# Patient Record
Sex: Female | Born: 1955 | Race: White | Hispanic: No | Marital: Married | Smoking: Current every day smoker
Health system: Southern US, Community
[De-identification: ages and names within clinical notes are randomized; demographics above are authoritative.]

## PROBLEM LIST (undated history)

## (undated) DIAGNOSIS — R569 Unspecified convulsions: Secondary | ICD-10-CM

## (undated) DIAGNOSIS — I82409 Acute embolism and thrombosis of unspecified deep veins of unspecified lower extremity: Secondary | ICD-10-CM

## (undated) DIAGNOSIS — K219 Gastro-esophageal reflux disease without esophagitis: Secondary | ICD-10-CM

## (undated) DIAGNOSIS — K5792 Diverticulitis of intestine, part unspecified, without perforation or abscess without bleeding: Secondary | ICD-10-CM

## (undated) DIAGNOSIS — F419 Anxiety disorder, unspecified: Secondary | ICD-10-CM

## (undated) HISTORY — DX: Acute embolism and thrombosis of unspecified deep veins of unspecified lower extremity: I82.409

## (undated) HISTORY — PX: ENDOMETRIAL ABLATION: SHX621

## (undated) HISTORY — DX: Gastro-esophageal reflux disease without esophagitis: K21.9

## (undated) HISTORY — DX: Diverticulitis of intestine, part unspecified, without perforation or abscess without bleeding: K57.92

## (undated) HISTORY — DX: Anxiety disorder, unspecified: F41.9

## (undated) HISTORY — PX: TONSILLECTOMY: SUR1361

## (undated) HISTORY — PX: ABDOMINAL HYSTERECTOMY: SHX81

## (undated) HISTORY — PX: BREAST BIOPSY: SHX20

## (undated) HISTORY — DX: Unspecified convulsions: R56.9

---

## 1998-11-18 ENCOUNTER — Other Ambulatory Visit: Admission: RE | Admit: 1998-11-18 | Discharge: 1998-11-18 | Payer: Self-pay | Admitting: Obstetrics and Gynecology

## 1999-10-28 ENCOUNTER — Encounter: Payer: Self-pay | Admitting: Internal Medicine

## 1999-10-28 ENCOUNTER — Ambulatory Visit (HOSPITAL_COMMUNITY): Admission: RE | Admit: 1999-10-28 | Discharge: 1999-10-28 | Payer: Self-pay | Admitting: Internal Medicine

## 2000-02-16 ENCOUNTER — Other Ambulatory Visit: Admission: RE | Admit: 2000-02-16 | Discharge: 2000-02-16 | Payer: Self-pay | Admitting: Obstetrics and Gynecology

## 2000-11-09 ENCOUNTER — Encounter: Payer: Self-pay | Admitting: Internal Medicine

## 2000-11-09 ENCOUNTER — Encounter: Admission: RE | Admit: 2000-11-09 | Discharge: 2000-11-09 | Payer: Self-pay | Admitting: Internal Medicine

## 2001-01-21 ENCOUNTER — Encounter: Admission: RE | Admit: 2001-01-21 | Discharge: 2001-01-21 | Payer: Self-pay | Admitting: Internal Medicine

## 2001-01-21 ENCOUNTER — Encounter: Payer: Self-pay | Admitting: Internal Medicine

## 2002-01-14 ENCOUNTER — Other Ambulatory Visit: Admission: RE | Admit: 2002-01-14 | Discharge: 2002-01-14 | Payer: Self-pay | Admitting: Obstetrics and Gynecology

## 2002-07-08 ENCOUNTER — Encounter: Payer: Self-pay | Admitting: Obstetrics and Gynecology

## 2002-07-14 ENCOUNTER — Inpatient Hospital Stay (HOSPITAL_COMMUNITY): Admission: RE | Admit: 2002-07-14 | Discharge: 2002-07-16 | Payer: Self-pay | Admitting: Obstetrics and Gynecology

## 2002-07-14 ENCOUNTER — Encounter (INDEPENDENT_AMBULATORY_CARE_PROVIDER_SITE_OTHER): Payer: Self-pay

## 2002-10-15 ENCOUNTER — Encounter: Admission: RE | Admit: 2002-10-15 | Discharge: 2002-10-15 | Payer: Self-pay | Admitting: Sports Medicine

## 2002-11-12 ENCOUNTER — Encounter: Admission: RE | Admit: 2002-11-12 | Discharge: 2002-11-12 | Payer: Self-pay | Admitting: Sports Medicine

## 2005-08-02 ENCOUNTER — Ambulatory Visit: Payer: Self-pay | Admitting: Sports Medicine

## 2007-03-17 ENCOUNTER — Encounter: Admission: RE | Admit: 2007-03-17 | Discharge: 2007-03-17 | Payer: Self-pay | Admitting: Internal Medicine

## 2007-10-14 ENCOUNTER — Ambulatory Visit: Payer: Self-pay | Admitting: Internal Medicine

## 2008-06-26 ENCOUNTER — Other Ambulatory Visit: Admission: RE | Admit: 2008-06-26 | Discharge: 2008-06-26 | Payer: Self-pay | Admitting: Internal Medicine

## 2008-06-26 ENCOUNTER — Ambulatory Visit: Payer: Self-pay | Admitting: Internal Medicine

## 2008-07-17 ENCOUNTER — Ambulatory Visit: Payer: Self-pay | Admitting: Internal Medicine

## 2008-07-17 ENCOUNTER — Encounter: Admission: RE | Admit: 2008-07-17 | Discharge: 2008-07-17 | Payer: Self-pay | Admitting: Internal Medicine

## 2008-07-27 ENCOUNTER — Ambulatory Visit: Payer: Self-pay | Admitting: Internal Medicine

## 2008-10-16 ENCOUNTER — Ambulatory Visit: Payer: Self-pay | Admitting: Internal Medicine

## 2008-12-28 ENCOUNTER — Ambulatory Visit: Payer: Self-pay | Admitting: Internal Medicine

## 2009-08-09 ENCOUNTER — Ambulatory Visit (HOSPITAL_COMMUNITY): Admission: RE | Admit: 2009-08-09 | Discharge: 2009-08-09 | Payer: Self-pay | Admitting: Internal Medicine

## 2009-08-09 ENCOUNTER — Encounter: Payer: Self-pay | Admitting: Internal Medicine

## 2010-05-27 NOTE — Op Note (Signed)
NAME:  Anita Wagner, Anita Wagner                        ACCOUNT NO.:  [id number]   MEDICAL RECORD NO.:  [id number]                   PATIENT TYPE:  INP   LOCATION:  0003                                 FACILITY:  [facility]   PHYSICIAN:  Cynthia P. Romine, M.D.             DATE OF BIRTH:  12-19-1955   DATE OF PROCEDURE:  07/14/2002  DATE OF DISCHARGE:                                 OPERATIVE REPORT   PREOPERATIVE DIAGNOSIS:  Dysmenorrhea, abnormal uterine bleeding,  endometriosis.   POSTOPERATIVE DIAGNOSIS:  Dysmenorrhea, abnormal uterine bleeding,  endometriosis. Pathology pending.   OPERATION PERFORMED:  Total abdominal hysterectomy, bilateral salpingo-  oophorectomy.   SURGEON:  Cynthia P. Romine, M.D.   ASSISTANT:  Andres Ege, M.D.   ANESTHESIA:  General endotracheal.   ESTIMATED BLOOD LOSS:  .   COMPLICATIONS:  None.   DESCRIPTION OF PROCEDURE:  The patient was taken to the operating room and  after the induction of adequate general endotracheal anesthesia was placed  in supine position and prepped and draped in the usual sterile fashion.  A  Foley catheter was inserted.  A Pfannenstiel incision was made and carried  down to the fascia using a deep knife.  The fascia was nicked and opened  transversely.  A Kocher was used to grasp the fascial margins.  Then we  separated the midline rectus muscles using the Bovie.  The rectus muscles  were separated bluntly in the midline.  The underlying peritoneum was  elevated between hemostats and entered atraumatically.  The peritoneum was  opened vertically.  The abdomen was explored.  The liver edge was smooth.  The gallbladder contained no stones.  The aorta and pelvic vessels were  without signs of surrounding adenopathy.  In the pelvis the uterus was small  and mobile.  The right ovary had several powder burn areas of endometriosis  on it and the left ovary was adherent to the posterior leaves of the broad  ligament and  contained some powder burn areas of endometriosis.  The round  ligaments were stitched and divided.  The anterior leaf of the broad  ligament was taken down.  The ureters were identified bilaterally.  The  infundibulopelvic arteries were skeletonized, doubly clamped, cut and doubly  tied with 0 Vicryl.  The uterine arteries were skeletonized, clamped, cut  and doubly tied.  The cardinal ligaments were clamped, cut and tied down to  the level of the uterosacral ligament.  The vagina was entered at the left  angle and the uterus removed with Satinsky scissors.  The vaginal cuff was  grasped with Kochers.  Angle sutures were placed at the right and left and  then the cuff was closed with interrupted figure-of-eight sutures of 0  Vicryl.  The pelvis was irrigated and felt to be hemostatic.  The  uterosacral ligaments were tied together in the midline for pelvic support.  The packs were removed.  The bowel was allowed to return to its anatomic  position.  The peritoneum was closed in running fashion with 2-0 chromic.  The rectus muscles were irrigated and hemostasis was achieved with the  Bovie.  The fascia was closed with 0 Vicryl going from each angle towards  the midline.  The subcutaneous tissue was irrigated, hemostasis was achieved  with a Bovie.  The skin was closed with subcuticular stitch of 4-0 Vicryl  Rapide.  The  incision was injected with 10mL of 0.5% Marcaine with epinephrine.  Benzoin  was applied to the skin.  Steri-Strips were applied and dressing was  applied.  The procedure was terminated.  The patient tolerated the procedure  well and went in satisfactory condition to post anesthesia recovery.                                                Cynthia P. Romine, M.D.    CPR/MEDQ  D:  07/14/2002  T:  07/14/2002  Job:  [id number]

## 2010-06-27 ENCOUNTER — Ambulatory Visit (INDEPENDENT_AMBULATORY_CARE_PROVIDER_SITE_OTHER): Payer: BC Managed Care – PPO | Admitting: Internal Medicine

## 2010-06-27 ENCOUNTER — Encounter: Payer: Self-pay | Admitting: Internal Medicine

## 2010-06-27 DIAGNOSIS — F419 Anxiety disorder, unspecified: Secondary | ICD-10-CM

## 2010-06-27 DIAGNOSIS — H6690 Otitis media, unspecified, unspecified ear: Secondary | ICD-10-CM

## 2010-06-27 DIAGNOSIS — K219 Gastro-esophageal reflux disease without esophagitis: Secondary | ICD-10-CM

## 2010-06-27 DIAGNOSIS — H6692 Otitis media, unspecified, left ear: Secondary | ICD-10-CM

## 2010-06-27 DIAGNOSIS — J309 Allergic rhinitis, unspecified: Secondary | ICD-10-CM

## 2010-06-27 DIAGNOSIS — F411 Generalized anxiety disorder: Secondary | ICD-10-CM

## 2010-07-06 DIAGNOSIS — J309 Allergic rhinitis, unspecified: Secondary | ICD-10-CM | POA: Insufficient documentation

## 2010-07-06 DIAGNOSIS — K219 Gastro-esophageal reflux disease without esophagitis: Secondary | ICD-10-CM | POA: Insufficient documentation

## 2010-07-06 DIAGNOSIS — F419 Anxiety disorder, unspecified: Secondary | ICD-10-CM | POA: Insufficient documentation

## 2010-07-06 NOTE — Progress Notes (Signed)
  Subjective:    Patient ID: Anita Wagner, female    DOB: 16-Aug-1955, 55 y.o.   MRN: [id number]  HPI onset yesterday of left ear pain. History of allergic rhinitis, GE reflux, anxiety. Some stuffy nose. No discolored nasal drainage or productive cough. Anxiety under good control with Klonopin. Takes Protonix generic 40 mg daily for reflux.    Review of Systems     Objective:   Physical Exam left TM is full and pink. Right TM slightly full but not discolored. Boggy nasal mucosa. Pharynx is clear. Neck is supple without adenopathy. Chest is clear to auscultation without rales or wheezing        Assessment & Plan:  1-left otitis media    2-allergic rhinitis  3-anxiety-stable  4-GE reflux treated with generic protonix  Plan Zithromax Z-Pak 2 tablets by mouth day one followed by 1 tablet by mouth days 2 through 5.

## 2010-07-06 NOTE — Patient Instructions (Signed)
Take meds as directed. Call if not better in 10 days sooner if worse.

## 2010-07-15 ENCOUNTER — Other Ambulatory Visit: Payer: Self-pay

## 2010-07-15 MED ORDER — CLONAZEPAM 0.5 MG PO TABS: 0.5000 mg | ORAL_TABLET | Freq: Two times a day (BID) | ORAL | Status: DC | PRN

## 2010-09-26 ENCOUNTER — Other Ambulatory Visit: Payer: BC Managed Care – PPO | Admitting: Internal Medicine

## 2010-09-27 ENCOUNTER — Encounter: Payer: BC Managed Care – PPO | Admitting: Internal Medicine

## 2010-11-08 ENCOUNTER — Encounter: Payer: Self-pay | Admitting: Internal Medicine

## 2010-11-08 ENCOUNTER — Ambulatory Visit (INDEPENDENT_AMBULATORY_CARE_PROVIDER_SITE_OTHER): Payer: BC Managed Care – PPO | Admitting: Internal Medicine

## 2010-11-08 VITALS — BP 118/72 | HR 76 | Temp 98.9°F | Wt 164.0 lb

## 2010-11-08 DIAGNOSIS — F411 Generalized anxiety disorder: Secondary | ICD-10-CM

## 2010-11-08 DIAGNOSIS — Z87891 Personal history of nicotine dependence: Secondary | ICD-10-CM

## 2010-11-08 DIAGNOSIS — F419 Anxiety disorder, unspecified: Secondary | ICD-10-CM

## 2010-11-08 DIAGNOSIS — J069 Acute upper respiratory infection, unspecified: Secondary | ICD-10-CM

## 2010-11-08 NOTE — Patient Instructions (Signed)
Please take Zithromax Z-PAK as directed. You have one refill on this prescription. If not better in 7 days, have prescription refill. Please return in 2-3 weeks for influenza immunization and Pneumovax immunization

## 2010-11-08 NOTE — Progress Notes (Signed)
  Subjective:    Patient ID: Marya Fossa, female    DOB: Apr 05, 1955, 55 y.o.   MRN: [id number]  HPI patient in today with sore throat and ear pain. Has been around some children who were ill recently. Husband is 45 years old and has retired. She continues to work in Therapist, sports. Continues to smoke. Her mother lives alone, has some health issues, financial problems, and is causing patient some stress.    Review of Systems     Objective:   Physical Exam HEENT exam: Pharynx slightly injected, left TM is full but not red right TM is within normal limits neck is supple; no significant adenopathy; chest clear to auscultation        Assessment & Plan:  URI  Anxiety  History of smoking  Plan: Zithromax Z-Pak take hysterectomy with one refill. If not better in one week patient is to refill Zithromax Z-PAK. Patient wants to return soon for influenza immunization and Pneumovax immunization.

## 2010-11-30 ENCOUNTER — Other Ambulatory Visit: Payer: Self-pay

## 2010-11-30 MED ORDER — ESTRADIOL 0.05 MG/24HR TD PTTW: 1.0000 | MEDICATED_PATCH | TRANSDERMAL | Status: DC

## 2010-12-09 ENCOUNTER — Ambulatory Visit (INDEPENDENT_AMBULATORY_CARE_PROVIDER_SITE_OTHER): Payer: BC Managed Care – PPO | Admitting: Internal Medicine

## 2010-12-09 DIAGNOSIS — Z23 Encounter for immunization: Secondary | ICD-10-CM

## 2010-12-09 NOTE — Patient Instructions (Signed)
You have received influenza vaccine today. You may take Tylenol as needed over the next 24 hours for arm soreness

## 2010-12-09 NOTE — Progress Notes (Signed)
  Subjective:    Patient ID: Anita Wagner, female    DOB: [birth year], 55 y.o.   MRN: [id number]  HPI influenza immunization administered by nurse today    Review of Systems     Objective:   Physical Exam        Assessment & Plan:  Flu vaccine

## 2011-03-09 ENCOUNTER — Other Ambulatory Visit: Payer: Self-pay | Admitting: Internal Medicine

## 2011-03-27 ENCOUNTER — Other Ambulatory Visit: Payer: Self-pay

## 2011-03-27 MED ORDER — CLONAZEPAM 0.5 MG PO TABS: 0.5000 mg | ORAL_TABLET | Freq: Two times a day (BID) | ORAL | Status: DC | PRN

## 2011-04-17 ENCOUNTER — Other Ambulatory Visit: Payer: Self-pay

## 2011-04-17 MED ORDER — TRIAMCINOLONE ACETONIDE 0.1 % EX CREA: TOPICAL_CREAM | Freq: Two times a day (BID) | CUTANEOUS | Status: AC

## 2011-04-21 ENCOUNTER — Other Ambulatory Visit: Payer: Self-pay

## 2011-10-05 ENCOUNTER — Other Ambulatory Visit: Payer: Self-pay | Admitting: Internal Medicine

## 2011-10-05 ENCOUNTER — Ambulatory Visit (INDEPENDENT_AMBULATORY_CARE_PROVIDER_SITE_OTHER): Payer: BC Managed Care – PPO | Admitting: Internal Medicine

## 2011-10-05 VITALS — Temp 98.0°F

## 2011-10-05 DIAGNOSIS — J029 Acute pharyngitis, unspecified: Secondary | ICD-10-CM

## 2011-10-05 NOTE — Telephone Encounter (Signed)
Please check when pt last had PE or visit for anxiety and refill accordingly with appropriate appt.

## 2011-10-09 ENCOUNTER — Encounter: Payer: Self-pay | Admitting: Internal Medicine

## 2011-10-09 NOTE — Patient Instructions (Addendum)
Take Zithromax 2 tablets day one followed by 1 tablet days 2 through 5. Call if not better in one week. 

## 2011-10-09 NOTE — Progress Notes (Signed)
  Subjective:    Patient ID: Anita Wagner, female    DOB: 05/15/1955, 56 y.o.   MRN: [id number]  HPI 56 year old white female with history of anxiety called around 5 PM saying that she had sore throat and wanted to make appointment for tomorrow. I will not be in the office tomorrow so I asked patient to come on over and be seen today. Patient says she's had sore throat now for over a week. She thought she would get better but has not gotten better. In fact has gotten a bit worse. Has not had any fever or shaking chills. However says she has malaise and fatigue and is concerned about the duration of sore throat. No discolored drainage. No significant cough.    Review of Systems     Objective:   Physical Exam HEENT exam: Slightly injected pharynx without exudate. TMs are slightly full bilaterally. Neck is supple without adenopathy or thyromegaly. Chest is clear to auscultation. Rapid strep screen was not done        Assessment & Plan:  Pharyngitis  Plan: Zithromax Z-Pak take 2 tablets day one followed by 1 tablet by mouth days 2 through 5 with no refill.

## 2011-11-29 ENCOUNTER — Other Ambulatory Visit: Payer: Self-pay | Admitting: Internal Medicine

## 2011-12-04 ENCOUNTER — Other Ambulatory Visit: Payer: Self-pay | Admitting: Internal Medicine

## 2011-12-04 NOTE — Telephone Encounter (Signed)
Approved for #60/o refills. Appointment in December

## 2011-12-22 ENCOUNTER — Other Ambulatory Visit: Payer: BC Managed Care – PPO | Admitting: Internal Medicine

## 2011-12-22 DIAGNOSIS — K219 Gastro-esophageal reflux disease without esophagitis: Secondary | ICD-10-CM

## 2011-12-22 DIAGNOSIS — Z0000 Encounter for general adult medical examination without abnormal findings: Secondary | ICD-10-CM

## 2011-12-22 LAB — CBC WITH DIFFERENTIAL/PLATELET
Basophils Absolute: 0.1 10*3/uL (ref 0.0–0.1)
Basophils Relative: 1 % (ref 0–1)
Eosinophils Absolute: 0.3 10*3/uL (ref 0.0–0.7)
Eosinophils Relative: 3 % (ref 0–5)
HCT: 42.8 % (ref 36.0–46.0)
MCHC: 33.4 g/dL (ref 30.0–36.0)
MCV: 98.8 fL (ref 78.0–100.0)
Monocytes Absolute: 0.7 10*3/uL (ref 0.1–1.0)
Platelets: 287 10*3/uL (ref 150–400)
RDW: 12.7 % (ref 11.5–15.5)

## 2011-12-22 LAB — LIPID PANEL
HDL: 64 mg/dL (ref >39)
LDL Cholesterol: 138 mg/dL — ABNORMAL HIGH (ref 0–99)
Total CHOL/HDL Ratio: 3.5 Ratio
VLDL: 20 mg/dL (ref 0–40)

## 2011-12-22 LAB — COMPREHENSIVE METABOLIC PANEL
AST: 16 U/L (ref 0–37)
Alkaline Phosphatase: 49 U/L (ref 39–117)
BUN: 14 mg/dL (ref 6–23)
Calcium: 9.2 mg/dL (ref 8.4–10.5)
Creat: 0.68 mg/dL (ref 0.50–1.10)
Total Bilirubin: 0.4 mg/dL (ref 0.3–1.2)

## 2011-12-22 LAB — TSH: TSH: 1.993 u[IU]/mL (ref 0.350–4.500)

## 2011-12-25 ENCOUNTER — Encounter: Payer: Self-pay | Admitting: Internal Medicine

## 2011-12-25 ENCOUNTER — Ambulatory Visit (INDEPENDENT_AMBULATORY_CARE_PROVIDER_SITE_OTHER): Payer: BC Managed Care – PPO | Admitting: Internal Medicine

## 2011-12-25 VITALS — BP 120/78 | HR 80 | Temp 97.9°F | Ht 66.25 in | Wt 179.0 lb

## 2011-12-25 DIAGNOSIS — Z23 Encounter for immunization: Secondary | ICD-10-CM

## 2011-12-25 DIAGNOSIS — K219 Gastro-esophageal reflux disease without esophagitis: Secondary | ICD-10-CM

## 2011-12-25 DIAGNOSIS — Z87891 Personal history of nicotine dependence: Secondary | ICD-10-CM

## 2011-12-25 DIAGNOSIS — Z5181 Encounter for therapeutic drug level monitoring: Secondary | ICD-10-CM

## 2011-12-25 DIAGNOSIS — F411 Generalized anxiety disorder: Secondary | ICD-10-CM

## 2011-12-25 DIAGNOSIS — Z79890 Hormone replacement therapy (postmenopausal): Secondary | ICD-10-CM

## 2011-12-25 MED ORDER — TETANUS-DIPHTH-ACELL PERTUSSIS 5-2.5-18.5 LF-MCG/0.5 IM SUSP: 0.5000 mL | Freq: Once | INTRAMUSCULAR | Status: DC

## 2011-12-27 ENCOUNTER — Other Ambulatory Visit: Payer: Self-pay | Admitting: Internal Medicine

## 2011-12-28 ENCOUNTER — Other Ambulatory Visit: Payer: Self-pay

## 2011-12-28 MED ORDER — ESTRADIOL 0.05 MG/24HR TD PTTW: 1.0000 | MEDICATED_PATCH | TRANSDERMAL | Status: DC

## 2012-01-12 ENCOUNTER — Other Ambulatory Visit: Payer: Self-pay | Admitting: Internal Medicine

## 2012-01-12 NOTE — Telephone Encounter (Signed)
Refill for 6 months. 

## 2012-02-20 ENCOUNTER — Other Ambulatory Visit: Payer: Self-pay | Admitting: Internal Medicine

## 2012-02-20 ENCOUNTER — Other Ambulatory Visit: Payer: Self-pay

## 2012-02-20 NOTE — Telephone Encounter (Signed)
Please refill for 6 months 

## 2012-03-23 ENCOUNTER — Other Ambulatory Visit: Payer: Self-pay | Admitting: Internal Medicine

## 2012-03-23 NOTE — Telephone Encounter (Signed)
Refill x 6 months 

## 2012-04-03 ENCOUNTER — Encounter: Payer: Self-pay | Admitting: Internal Medicine

## 2012-04-03 DIAGNOSIS — Z87891 Personal history of nicotine dependence: Secondary | ICD-10-CM | POA: Insufficient documentation

## 2012-04-03 NOTE — Patient Instructions (Addendum)
Continue same medications and return in 6 months for followup on anxiety

## 2012-04-03 NOTE — Progress Notes (Signed)
  Subjective:    Patient ID: Anita Wagner, female    DOB: 01-Apr-1955, 57 y.o.   MRN: [id number]  HPI 57 year old white female with history of anxiety and for health maintenance and evaluation of medical problems. History of GE reflux. History of allergic rhinitis. Multiple drug intolerances including Augmentin, amoxicillin, sulfa, Levaquin. Intolerant of erythromycin but can take Zithromax.  Tonsillectomy at age 19, laparoscopic surgery for endometriosis 1997, total, hysterectomy BSO for endometriosis July 2004.  Influenza and tetanus immunization update given today.  Benign left needle breast biopsy 2004.  Social history: Patient is married. She has a high school education. Husband is a retired Teacher, adult education. She is in Therapist, sports. She has smoked for 20 years or more. Smokes about a pack a day One glass of wine daily. We have discussed smoking cessation many times and she's been unable to quit. No children. First marriage ended in divorce.  Family history: Father died in 89 with lung cancer. Mother with health issues. One brother in good health.    Review of Systems  Constitutional: Negative.   HENT: Negative.   Eyes: Negative.   Respiratory: Negative.   Cardiovascular: Negative.   Gastrointestinal:       GE reflux  Endocrine: Negative.   Genitourinary: Negative.   Allergic/Immunologic: Positive for environmental allergies.  Neurological: Negative.   Psychiatric/Behavioral:       Anxiety       Objective:   Physical Exam  Vitals reviewed. Constitutional: She is oriented to person, place, and time. She appears well-developed and well-nourished. No distress.  HENT:  Head: Normocephalic and atraumatic.  Right Ear: External ear normal.  Left Ear: External ear normal.  Nose: Nose normal.  Mouth/Throat: Oropharynx is clear and moist. No oropharyngeal exudate.  Eyes: Conjunctivae and EOM are normal. Pupils are equal, round, and reactive to light. Right eye exhibits no  discharge. Left eye exhibits no discharge. No scleral icterus.  Neck: Neck supple. No JVD present. No thyromegaly present.  Cardiovascular: Normal rate, regular rhythm, normal heart sounds and intact distal pulses.   No murmur heard. Pulmonary/Chest: Effort normal and breath sounds normal. No respiratory distress. She has no wheezes. She has no rales. She exhibits no tenderness.  Abdominal: Soft. Bowel sounds are normal. She exhibits no distension and no mass. There is no tenderness. There is no rebound and no guarding.  Musculoskeletal: Normal range of motion. She exhibits no edema.  Lymphadenopathy:    She has no cervical adenopathy.  Neurological: She is alert and oriented to person, place, and time. She has normal reflexes. She displays normal reflexes. No cranial nerve deficit. Coordination normal.  Skin: Skin is warm and dry. No rash noted. She is not diaphoretic.  Psychiatric: She has a normal mood and affect. Her behavior is normal. Judgment and thought content normal.          Assessment & Plan:  Anxiety-stable on Klonopin  GE reflux-stable on PPI  History of smoking -once again smoking cessation counseling given  Estrogen replacement-continue estrogen patch    Plan: Once again counseled regarding smoking cessation. Refill Klonopin for anxiety for 6 months. Continue PPI for reflux. Discussed colonoscopy. Tdap and influenza vaccine up date given.

## 2012-05-23 ENCOUNTER — Ambulatory Visit: Payer: Self-pay | Admitting: Internal Medicine

## 2012-06-11 ENCOUNTER — Other Ambulatory Visit: Payer: Self-pay | Admitting: Internal Medicine

## 2012-06-11 NOTE — Telephone Encounter (Signed)
Refill once 

## 2012-06-27 ENCOUNTER — Ambulatory Visit: Payer: BC Managed Care – PPO | Admitting: Internal Medicine

## 2012-07-04 ENCOUNTER — Ambulatory Visit: Payer: Self-pay | Admitting: Internal Medicine

## 2012-07-26 ENCOUNTER — Other Ambulatory Visit: Payer: Self-pay | Admitting: Internal Medicine

## 2012-07-26 NOTE — Telephone Encounter (Signed)
Refill through December. Needs to book CPE after mid December

## 2012-08-06 ENCOUNTER — Encounter: Payer: Self-pay | Admitting: Internal Medicine

## 2012-08-06 ENCOUNTER — Ambulatory Visit (INDEPENDENT_AMBULATORY_CARE_PROVIDER_SITE_OTHER): Payer: BC Managed Care – PPO | Admitting: Internal Medicine

## 2012-08-06 VITALS — BP 112/72 | Temp 99.1°F | Wt 185.5 lb

## 2012-08-06 DIAGNOSIS — K219 Gastro-esophageal reflux disease without esophagitis: Secondary | ICD-10-CM

## 2012-08-06 DIAGNOSIS — Z87891 Personal history of nicotine dependence: Secondary | ICD-10-CM

## 2012-08-06 DIAGNOSIS — F411 Generalized anxiety disorder: Secondary | ICD-10-CM

## 2012-08-06 DIAGNOSIS — Z5181 Encounter for therapeutic drug level monitoring: Secondary | ICD-10-CM

## 2012-08-06 MED ORDER — ESOMEPRAZOLE MAGNESIUM 40 MG PO CPDR: 40.0000 mg | DELAYED_RELEASE_CAPSULE | Freq: Every day | ORAL | Status: DC

## 2012-08-06 MED ORDER — CLONAZEPAM 0.5 MG PO TABS: ORAL_TABLET | ORAL | Status: DC

## 2012-08-11 NOTE — Progress Notes (Signed)
  Subjective:    Patient ID: Anita Wagner, female    DOB: 1955/06/24, 57 y.o.   MRN: [id number]  HPI 57 year old White female with history of anxiety, allergic rhinitis, GE reflux, history of smoking, estrogen replacement for six-month recheck. GE reflux seems to be worse recently. ENT  advised her to increase generic Protonix to twice daily but that does not seem to be helping. She continues to smoke and eats late at night. Not getting much exercise at all. Works long hours. Mother is [facility] and has dementia. Patient has been busy taking care of her as well.    Review of Systems     Objective:   Physical Exam       spent 25 minutes    speaking with patient about these issues particularly with regard to anxiety and taking care of her elderly mother. Talked about setting limits. GE reflux seems to not be well controlled at present time. Talked with patient about smoking cessation. She's finding that hard to do right now with stress in her life. Stable on hormone replacement therapy consisting of Vivelle-Dot.        Assessment & Plan:  Anxiety is stable-refill Klonopin for 6 months  GE reflux change from Protonix twice daily to Nexium 40 mg daily  Estrogen replacement-stable on Vivelle-Dot  Allergic rhinitis-stable  History of smoking-not willing to quit at present time  Plan: Return in 6 months for physical examination

## 2012-08-11 NOTE — Patient Instructions (Addendum)
Change to generic Protonix to Nexium 40 mg daily. Return in 6 months for physical exam. Continue Klonopin. Please try to quit smoking. Try to keep earlier in the evening. Diet and exercise recommended.

## 2012-10-02 ENCOUNTER — Ambulatory Visit (INDEPENDENT_AMBULATORY_CARE_PROVIDER_SITE_OTHER): Payer: BC Managed Care – PPO | Admitting: Internal Medicine

## 2012-10-02 DIAGNOSIS — Z23 Encounter for immunization: Secondary | ICD-10-CM

## 2012-12-15 ENCOUNTER — Encounter (HOSPITAL_COMMUNITY): Payer: Self-pay | Admitting: Emergency Medicine

## 2012-12-15 ENCOUNTER — Emergency Department (HOSPITAL_COMMUNITY)
Admission: EM | Admit: 2012-12-15 | Discharge: 2012-12-15 | Disposition: A | Discharge status: Home / Self Care | Payer: BC Managed Care – PPO | Attending: Emergency Medicine | Admitting: Emergency Medicine

## 2012-12-15 DIAGNOSIS — Z882 Allergy status to sulfonamides status: Secondary | ICD-10-CM | POA: Insufficient documentation

## 2012-12-15 DIAGNOSIS — F411 Generalized anxiety disorder: Secondary | ICD-10-CM | POA: Insufficient documentation

## 2012-12-15 DIAGNOSIS — I82401 Acute embolism and thrombosis of unspecified deep veins of right lower extremity: Secondary | ICD-10-CM

## 2012-12-15 DIAGNOSIS — I824Y9 Acute embolism and thrombosis of unspecified deep veins of unspecified proximal lower extremity: Secondary | ICD-10-CM | POA: Insufficient documentation

## 2012-12-15 DIAGNOSIS — Z79899 Other long term (current) drug therapy: Secondary | ICD-10-CM | POA: Insufficient documentation

## 2012-12-15 DIAGNOSIS — K219 Gastro-esophageal reflux disease without esophagitis: Secondary | ICD-10-CM | POA: Insufficient documentation

## 2012-12-15 DIAGNOSIS — J309 Allergic rhinitis, unspecified: Secondary | ICD-10-CM | POA: Insufficient documentation

## 2012-12-15 DIAGNOSIS — Z881 Allergy status to other antibiotic agents status: Secondary | ICD-10-CM | POA: Insufficient documentation

## 2012-12-15 DIAGNOSIS — M7989 Other specified soft tissue disorders: Secondary | ICD-10-CM

## 2012-12-15 DIAGNOSIS — M79609 Pain in unspecified limb: Secondary | ICD-10-CM

## 2012-12-15 DIAGNOSIS — Z7901 Long term (current) use of anticoagulants: Secondary | ICD-10-CM | POA: Insufficient documentation

## 2012-12-15 DIAGNOSIS — F17200 Nicotine dependence, unspecified, uncomplicated: Secondary | ICD-10-CM | POA: Insufficient documentation

## 2012-12-15 DIAGNOSIS — IMO0001 Reserved for inherently not codable concepts without codable children: Secondary | ICD-10-CM | POA: Insufficient documentation

## 2012-12-15 MED ORDER — RIVAROXABAN 15 MG PO TABS
15.0000 mg | ORAL_TABLET | Freq: Two times a day (BID) | ORAL | Status: DC
  Administered 2012-12-15: 15 mg via ORAL
  Filled 2012-12-15 (×2): qty 1

## 2012-12-15 MED ORDER — RIVAROXABAN 20 MG PO TABS: 20.0000 mg | ORAL_TABLET | Freq: Every day | ORAL | Status: DC

## 2012-12-15 MED ORDER — XARELTO VTE STARTER PACK 15 & 20 MG PO TBPK: 15.0000 mg | ORAL_TABLET | ORAL | Status: DC

## 2012-12-15 MED ORDER — RIVAROXABAN (XARELTO) EDUCATION KIT FOR DVT/PE PATIENTS
PACK | Freq: Once | Status: AC
  Administered 2012-12-15: 14:00:00
  Filled 2012-12-15: qty 1

## 2012-12-15 NOTE — ED Notes (Signed)
Vascular at bedside

## 2012-12-15 NOTE — ED Notes (Signed)
Pt reports having mild right lower leg pain x 1 week, having swelling to right ankle this am. Reports frequent traveling, no injury to leg.

## 2012-12-15 NOTE — ED Notes (Signed)
PT comfortable with d/c and f/u instructions. Prescriptions for Xarelto given and starter pack given, instructions reviewed. Pt verbalized understanding of f/u instructions. Pt ambulatory from room in NAD

## 2012-12-15 NOTE — ED Provider Notes (Signed)
CSN: [id number]     Arrival date & time 12/15/12  1058 History   First MD Initiated Contact with Patient 12/15/12 1131     Chief Complaint  Patient presents with  . Leg Pain   (Consider location/radiation/quality/duration/timing/severity/associated sxs/prior Treatment) HPI Comments: Patient is a 57 y/o female with a hx of frequent car travel and 1ppd smoking hx who presents today for RLE pain. Patient states that symptom onset was 1 week ago and has been constant since onset. Pain is "sore" and throbbing in nature and present in her R calf muscle. Symptoms associated with claudication and, this morning, became associated with swelling in patient's R ankle. Patient states that driving her car makes her symptoms worse because she is frequently switching between the gas and the brake. She endorses some relief of pain with ibuprofen. She denies associated fever, cough, SOB, CP, hemoptysis, N/V, redness, heat to touch in her RLE, and pallor.   Patient denies a hx of DVT/PE, recent surgeries or hospitalizations, and coagulopathies. She is not currently on any blood thinners.  Patient is a 57 y.o. female presenting with leg pain. The history is provided by the patient. No language interpreter was used.  Leg Pain Associated symptoms: no fever     Past Medical History  Diagnosis Date  . Anxiety   . GERD (gastroesophageal reflux disease)   . Allergy     allergic rhinitis   Past Surgical History  Procedure Laterality Date  . Tonsillectomy    . Abdominal hysterectomy      [place] bso   Family History  Problem Relation Age of Onset  . Cancer Father    History  Substance Use Topics  . Smoking status: Current Every Day Smoker -- 1.00 packs/day for 40 years    Types: Cigarettes  . Smokeless tobacco: Never Used  . Alcohol Use: 3.5 oz/week    7 drink(s) per week   OB History   Grav Para Term Preterm Abortions TAB SAB Ect Mult Living                 Review of Systems  Constitutional:  Negative for fever.  Respiratory: Negative for cough, chest tightness and shortness of breath.   Cardiovascular: Positive for leg swelling. Negative for chest pain.  Musculoskeletal: Positive for joint swelling and myalgias.  Skin: Negative for color change and pallor.  Neurological: Negative for weakness and numbness.  All other systems reviewed and are negative.    Allergies  Amoxicillin-pot clavulanate; Sulfa antibiotics; Erythromycin; and Levofloxacin  Home Medications   Current Outpatient Rx  Name  Route  Sig  Dispense  Refill  . clonazePAM (KLONOPIN) 0.5 MG tablet      TAKE 1 TABLET TWICE A DAY AS NEEDED   60 tablet   5   . esomeprazole (NEXIUM) 40 MG capsule   Oral   Take 1 capsule (40 mg total) by mouth daily.   30 capsule   5   . estradiol (VIVELLE-DOT) 0.05 MG/24HR   Transdermal   Place 1 patch (0.05 mg total) onto the skin 2 (two) times a week.   8 patch   12     This is Minivelle, since Vivelle is on backorder   . ibuprofen (ADVIL,MOTRIN) 200 MG tablet   Oral   Take 200 mg by mouth every 6 (six) hours as needed.         Carlena Hurl STARTER PACK 15 & 20 MG TBPK   Oral   Take 15-20  mg by mouth as directed. Take as directed on package: Start with one 15mg  tablet by mouth twice a day with food. On Day 22, switch to one 20mg  tablet once a day with food for the next 6 months.   51 each   0     Dispense as written.    BP 137/64  Pulse 84  Temp(Src) 98.4 F (36.9 C) (Oral)  Resp 18  Ht 5\' 6"  (1.676 m)  Wt 192 lb 9.6 oz (87.363 kg)  BMI 31.10 kg/m2  SpO2 95%  Physical Exam  Nursing note and vitals reviewed. Constitutional: She is oriented to person, place, and time. She appears well-developed and well-nourished. No distress.  HENT:  Head: Normocephalic and atraumatic.  Eyes: Conjunctivae and EOM are normal. No scleral icterus.  Neck: Normal range of motion.  Cardiovascular: Normal rate, regular rhythm, normal heart sounds and intact distal pulses.    DP and PT pulses 2+ bilaterally. Capillary refill normal and bilateral lower extremities.  Pulmonary/Chest: Effort normal. No respiratory distress.  Musculoskeletal: Normal range of motion.       Right hip: Normal.       Right knee: She exhibits normal range of motion, no swelling, no effusion and no erythema. Tenderness found.       Right ankle: She exhibits swelling. She exhibits normal range of motion, no deformity, no laceration and normal pulse. No tenderness. Achilles tendon normal.       Right upper leg: Normal.       Right lower leg: She exhibits tenderness. She exhibits no bony tenderness, no swelling, no edema, no deformity and no laceration.       Right foot: She exhibits swelling (Mild). She exhibits normal range of motion, no tenderness, no bony tenderness and normal capillary refill.  Neurological: She is alert and oriented to person, place, and time.  Achilles and patellar reflexes 2+ bilaterally. Patient moves extremities without ataxia. No gross sensory deficits appreciated.  Skin: Skin is warm and dry. No rash noted. She is not diaphoretic. No erythema. No pallor.  Psychiatric: She has a normal mood and affect. Her behavior is normal.    ED Course  Procedures (including critical care time) Labs Review Labs Reviewed - No data to display Imaging Review No results found.  EKG Interpretation   None      VASCULAR LAB  PRELIMINARY PRELIMINARY PRELIMINARY PRELIMINARY  Right lower extremity venous Doppler completed.  Preliminary report: There is DVT noted in a branch of the right popliteal vein.  KANADY, CANDACE, RVT  12/15/2012, 12:25 PM  MDM   1. DVT (deep venous thrombosis), right    57 year old female with a one pack per day smoking history and history of frequent prolonged car travel presents for right calf pain with swelling to her right ankle. Patient neurovascularly intact and hemodynamically stable. She is well and nontoxic appearing and afebrile. Denies hx  of DVT/PE and coagulopathies.  Physical exam today significant for tenderness to the right calf muscle as well as mild swelling without pitting edema in the right ankle. Venous duplex today significant for DVT in a branch of the right popliteal vein. The suspicion low at this time for pulmonary embolism given lack of tachycardia, tachypnea, dyspnea, or hypoxia. Patient denies associated cough, chest pain, shortness of breath, syncope, and hemoptysis. Patient started on Xarelto 15mg  today which she has been instructed to continue BID x 3 weeks and to switch to 20mg  daily on Day 22. Have also instructed PCP follow  up this week.  Patient stable for d/c today. Strict return precautions given should patient develop syncope, CP, SOB, chest tightness, signs of acute blood loss, etc. Smoking cessation also strongly advised. Patient agreeable to plan with no unaddressed concerns.   Filed Vitals:   12/15/12 1119 12/15/12 1130 12/15/12 1200 12/15/12 1230  BP: 140/89 134/71 103/70 137/64  Pulse: 95 87 83 84  Temp: 98.4 F (36.9 C)     TempSrc: Oral     Resp: 18     Height: 5\' 6"  (1.676 m)     Weight: 192 lb 9.6 oz (87.363 kg)     SpO2: 97% 97% 97% 95%     Antony Madura, PA-C 12/15/12 1404

## 2012-12-15 NOTE — Progress Notes (Signed)
VASCULAR LAB PRELIMINARY  PRELIMINARY  PRELIMINARY  PRELIMINARY  Right lower extremity venous Doppler completed.    Preliminary report:  There is DVT noted in a branch of the right popliteal vein.  Kaedance Magos, RVT 12/15/2012, 12:25 PM

## 2012-12-15 NOTE — Progress Notes (Signed)
ANTICOAGULATION CONSULT NOTE - Initial Consult  Pharmacy Consult for xarelto Indication: DVT  Allergies  Allergen Reactions  . Amoxicillin-Pot Clavulanate Hives  . Sulfa Antibiotics Rash  . Erythromycin Nausea Only  . Levofloxacin Nausea Only    Patient Measurements: Height: 5\' 6"  (167.6 cm) Weight: 192 lb 9.6 oz (87.363 kg) IBW/kg (Calculated) : 59.3   Vital Signs: Temp: 98.4 F (36.9 C) (12/07 1119) Temp src: Oral (12/07 1119) BP: 137/64 mmHg (12/07 1230) Pulse Rate: 84 (12/07 1230)  Labs: No results found for this basename: HGB, HCT, PLT, APTT, LABPROT, INR, HEPARINUNFRC, CREATININE, CKTOTAL, CKMB, TROPONINI,  in the last 72 hours  Estimated Creatinine Clearance: 85.3 ml/min (by C-G formula based on Cr of 0.68).   Medical History: Past Medical History  Diagnosis Date  . Anxiety   . GERD (gastroesophageal reflux disease)   . Allergy     allergic rhinitis    Medications:  See med rec  Assessment: 57 yo lady with DVT to start xarelto.  She is at risk of VTE due to frequent car trips, smoking and use of estradiol patch. Goal of Therapy:  Adequate treatment VTE   Plan:  Xarelto 15 mg bid for 21 days then 20 mg po daily.  Monitor for S&S bleeding. Encourage pt to stop smoking and discontinue estrogen use.  Anita Wagner 12/15/2012,1:13 PM

## 2012-12-16 NOTE — ED Provider Notes (Signed)
Medical screening examination/treatment/procedure(s) were performed by non-physician practitioner and as supervising physician I was immediately available for consultation/collaboration.  EKG Interpretation   None        Shed Nixon R. Sonora Catlin, MD 12/16/12 1944 

## 2012-12-20 ENCOUNTER — Encounter: Payer: Self-pay | Admitting: Internal Medicine

## 2012-12-20 ENCOUNTER — Ambulatory Visit (INDEPENDENT_AMBULATORY_CARE_PROVIDER_SITE_OTHER): Payer: BC Managed Care – PPO | Admitting: Internal Medicine

## 2012-12-20 VITALS — BP 120/80 | HR 84 | Temp 98.6°F | Ht 66.0 in | Wt 191.0 lb

## 2012-12-20 DIAGNOSIS — I824Z1 Acute embolism and thrombosis of unspecified deep veins of right distal lower extremity: Secondary | ICD-10-CM

## 2012-12-20 DIAGNOSIS — I824Z9 Acute embolism and thrombosis of unspecified deep veins of unspecified distal lower extremity: Secondary | ICD-10-CM

## 2012-12-31 ENCOUNTER — Other Ambulatory Visit: Payer: Self-pay | Admitting: Internal Medicine

## 2013-01-10 ENCOUNTER — Ambulatory Visit (INDEPENDENT_AMBULATORY_CARE_PROVIDER_SITE_OTHER): Payer: 59 | Admitting: Internal Medicine

## 2013-01-10 ENCOUNTER — Encounter: Payer: Self-pay | Admitting: Internal Medicine

## 2013-01-10 VITALS — BP 134/82 | Temp 99.3°F | Wt 192.0 lb

## 2013-01-10 DIAGNOSIS — I82401 Acute embolism and thrombosis of unspecified deep veins of right lower extremity: Secondary | ICD-10-CM

## 2013-01-10 DIAGNOSIS — I82409 Acute embolism and thrombosis of unspecified deep veins of unspecified lower extremity: Secondary | ICD-10-CM

## 2013-01-20 ENCOUNTER — Ambulatory Visit: Payer: BC Managed Care – PPO | Admitting: Internal Medicine

## 2013-02-09 ENCOUNTER — Encounter: Payer: Self-pay | Admitting: Internal Medicine

## 2013-02-09 NOTE — Patient Instructions (Signed)
Take Xarelto 15 mg daily and return January 2. Out of work until then

## 2013-02-09 NOTE — Patient Instructions (Signed)
Increase around toe to 20 mg daily and return in 2 months

## 2013-02-09 NOTE — Progress Notes (Signed)
   Subjective:    Patient ID: Anita Wagner, female    DOB: 1955/01/11, 58 y.o.   MRN: [id number]  HPI Diagnosed December 7 in Emergency Department with right leg DVT. This occurred after driving for a number of hours to [place], [place] and back including time stuck in traffic. Patient did not stop during this long trip to get out and stretch her legs. She does have history of smoking and was on estrogen replacement but this is been discontinued. She's now on Xarelto 15 milligrams daily and is to go up to 20 mg daily. No issues with some relatives therapy. Wants to start back to work and I think it's possible but she'll need to take frequent breaks if she is driving around and stretch her legs. I will her to be else around toe for a total of 3 months. I will her to quit smoking.    Review of Systems     Objective:   Physical Exam Right leg slight tenderness but no significant pitting edema.       Assessment & Plan:  Right lower extremity DVT  Plan: Increase Xarelto to 20 mg daily. Return in 2 months for followup. Increase activity slowly.

## 2013-02-09 NOTE — Progress Notes (Signed)
   Subjective:    Patient ID: Anita Wagner, female    DOB: [birth year], 58 y.o.   MRN: [id number]  HPI Patient was diagnosed in the emergency Department December 7 with a right lower extremity DVT. She has no prior history of DVT. She is on estrogen replacement and is also a smoker. She was driving for a number of hours to [facility] and back and was caught in traffic. Did not stop to stretch her legs. She is very anxious about this. She's also anxious about her mother who has Alzheimer's disease and is in [facility]  assisted living facility. She realizes she needs to quit smoking. She also realizes she needs to come off estrogen replacement immediately. She was started on Xarelto in the emergency department 15 mg daily. She's here for followup.    Review of Systems     Objective:   Physical Exam Right lower extremity tender in the calf . Affect is anxious.       Assessment & Plan:  Right lower extremity DVT  Anxiety  Plan: Spent 25 minutes speaking with patient about all of these issues. She needs to come off of estrogen replacement immediately. She needs to quit smoking immediately as well. We talked about ways to quit smoking including nicotine patch, Chantix, slow taper. She will be on Xarelto 15 mg daily for 21 days and then increase to 20 mg daily. I'll see her again in early January.  25 minutes spent with patient speaking with her about DVT, smoking cessation, stopping estrogen replacement, anxiety.this

## 2013-02-10 ENCOUNTER — Other Ambulatory Visit: Payer: BC Managed Care – PPO | Admitting: Internal Medicine

## 2013-02-11 ENCOUNTER — Encounter: Payer: BC Managed Care – PPO | Admitting: Internal Medicine

## 2013-03-06 ENCOUNTER — Other Ambulatory Visit: Payer: Self-pay | Admitting: Internal Medicine

## 2013-03-10 ENCOUNTER — Other Ambulatory Visit: Payer: 59 | Admitting: Internal Medicine

## 2013-03-10 DIAGNOSIS — Z1329 Encounter for screening for other suspected endocrine disorder: Secondary | ICD-10-CM

## 2013-03-10 DIAGNOSIS — Z13228 Encounter for screening for other metabolic disorders: Secondary | ICD-10-CM

## 2013-03-10 DIAGNOSIS — Z13220 Encounter for screening for lipoid disorders: Secondary | ICD-10-CM

## 2013-03-10 DIAGNOSIS — Z130 Encounter for screening for diseases of the blood and blood-forming organs and certain disorders involving the immune mechanism: Secondary | ICD-10-CM

## 2013-03-10 DIAGNOSIS — I10 Essential (primary) hypertension: Secondary | ICD-10-CM

## 2013-03-11 LAB — CBC WITH DIFFERENTIAL/PLATELET
BASOS ABS: 0.1 10*3/uL (ref 0.0–0.1)
BASOS PCT: 1 % (ref 0–1)
EOS PCT: 3 % (ref 0–5)
Eosinophils Absolute: 0.2 10*3/uL (ref 0.0–0.7)
HCT: 40.1 % (ref 36.0–46.0)
Hemoglobin: 13.8 g/dL (ref 12.0–15.0)
Lymphocytes Relative: 33 % (ref 12–46)
Lymphs Abs: 2.3 10*3/uL (ref 0.7–4.0)
MCH: 32.6 pg (ref 26.0–34.0)
MCHC: 34.4 g/dL (ref 30.0–36.0)
MCV: 94.8 fL (ref 78.0–100.0)
MONO ABS: 0.4 10*3/uL (ref 0.1–1.0)
Monocytes Relative: 5 % (ref 3–12)
Neutro Abs: 4.1 10*3/uL (ref 1.7–7.7)
Neutrophils Relative %: 58 % (ref 43–77)
Platelets: 276 10*3/uL (ref 150–400)
RBC: 4.23 MIL/uL (ref 3.87–5.11)
RDW: 13.5 % (ref 11.5–15.5)
WBC: 7 10*3/uL (ref 4.0–10.5)

## 2013-03-11 LAB — COMPREHENSIVE METABOLIC PANEL
ALK PHOS: 52 U/L (ref 39–117)
ALT: 14 U/L (ref 0–35)
AST: 13 U/L (ref 0–37)
Albumin: 4.3 g/dL (ref 3.5–5.2)
BUN: 12 mg/dL (ref 6–23)
CO2: 28 mEq/L (ref 19–32)
CREATININE: 0.68 mg/dL (ref 0.50–1.10)
Calcium: 9.3 mg/dL (ref 8.4–10.5)
Chloride: 107 mEq/L (ref 96–112)
GLUCOSE: 92 mg/dL (ref 70–99)
Potassium: 4.3 mEq/L (ref 3.5–5.3)
Sodium: 143 mEq/L (ref 135–145)
Total Bilirubin: 0.4 mg/dL (ref 0.2–1.2)
Total Protein: 6.9 g/dL (ref 6.0–8.3)

## 2013-03-11 LAB — TSH: TSH: 1.975 u[IU]/mL (ref 0.350–4.500)

## 2013-03-11 LAB — VITAMIN D 25 HYDROXY (VIT D DEFICIENCY, FRACTURES): Vit D, 25-Hydroxy: 25 ng/mL — ABNORMAL LOW (ref 30–89)

## 2013-03-11 LAB — LIPID PANEL
CHOL/HDL RATIO: 3.1 ratio
Cholesterol: 198 mg/dL (ref 0–200)
HDL: 63 mg/dL (ref >39)
LDL Cholesterol: 116 mg/dL — ABNORMAL HIGH (ref 0–99)
TRIGLYCERIDES: 96 mg/dL (ref <150)
VLDL: 19 mg/dL (ref 0–40)

## 2013-03-12 ENCOUNTER — Other Ambulatory Visit: Payer: Self-pay | Admitting: Internal Medicine

## 2013-03-14 ENCOUNTER — Encounter: Payer: Self-pay | Admitting: Internal Medicine

## 2013-03-14 ENCOUNTER — Ambulatory Visit (INDEPENDENT_AMBULATORY_CARE_PROVIDER_SITE_OTHER): Payer: 59 | Admitting: Internal Medicine

## 2013-03-14 VITALS — BP 116/76 | HR 80 | Temp 98.5°F | Ht 66.75 in | Wt 188.0 lb

## 2013-03-14 DIAGNOSIS — F411 Generalized anxiety disorder: Secondary | ICD-10-CM

## 2013-03-14 DIAGNOSIS — K219 Gastro-esophageal reflux disease without esophagitis: Secondary | ICD-10-CM

## 2013-03-14 DIAGNOSIS — Z86718 Personal history of other venous thrombosis and embolism: Secondary | ICD-10-CM

## 2013-03-14 DIAGNOSIS — J309 Allergic rhinitis, unspecified: Secondary | ICD-10-CM

## 2013-03-14 DIAGNOSIS — R232 Flushing: Secondary | ICD-10-CM

## 2013-03-14 DIAGNOSIS — F17200 Nicotine dependence, unspecified, uncomplicated: Secondary | ICD-10-CM

## 2013-03-14 DIAGNOSIS — N951 Menopausal and female climacteric states: Secondary | ICD-10-CM

## 2013-03-14 MED ORDER — VENLAFAXINE HCL ER 75 MG PO CP24: 75.0000 mg | ORAL_CAPSULE | Freq: Every day | ORAL | Status: DC

## 2013-03-14 NOTE — Patient Instructions (Signed)
Discontinue Xarelto. Return in 4 weeks for lab work and followup on hot flashes. Start Effexor 75 mg 6 or daily for hot flashes.

## 2013-04-18 ENCOUNTER — Other Ambulatory Visit: Payer: 59 | Admitting: Internal Medicine

## 2013-04-18 DIAGNOSIS — Z86718 Personal history of other venous thrombosis and embolism: Secondary | ICD-10-CM

## 2013-04-21 ENCOUNTER — Other Ambulatory Visit: Payer: Self-pay

## 2013-04-21 LAB — PROTHROMBIN GENE MUTATION

## 2013-04-21 LAB — FACTOR 5 LEIDEN

## 2013-04-21 LAB — ANTITHROMBIN III: ANTITHROMB III FUNC: 94 % (ref 76–126)

## 2013-04-21 MED ORDER — FLUTICASONE PROPIONATE 50 MCG/ACT NA SUSP: 2.0000 | Freq: Every day | NASAL | Status: DC

## 2013-06-26 ENCOUNTER — Other Ambulatory Visit: Payer: Self-pay | Admitting: Physician Assistant

## 2013-06-30 ENCOUNTER — Encounter: Payer: Self-pay | Admitting: Internal Medicine

## 2013-06-30 ENCOUNTER — Ambulatory Visit (INDEPENDENT_AMBULATORY_CARE_PROVIDER_SITE_OTHER): Payer: 59 | Admitting: Internal Medicine

## 2013-06-30 VITALS — BP 136/78 | HR 84 | Wt 192.0 lb

## 2013-06-30 DIAGNOSIS — Z86718 Personal history of other venous thrombosis and embolism: Secondary | ICD-10-CM

## 2013-06-30 DIAGNOSIS — N951 Menopausal and female climacteric states: Secondary | ICD-10-CM

## 2013-06-30 DIAGNOSIS — Z23 Encounter for immunization: Secondary | ICD-10-CM

## 2013-06-30 DIAGNOSIS — F17200 Nicotine dependence, unspecified, uncomplicated: Secondary | ICD-10-CM

## 2013-06-30 DIAGNOSIS — R232 Flushing: Secondary | ICD-10-CM

## 2013-06-30 DIAGNOSIS — K219 Gastro-esophageal reflux disease without esophagitis: Secondary | ICD-10-CM

## 2013-06-30 MED ORDER — PNEUMOCOCCAL VAC POLYVALENT 25 MCG/0.5ML IJ INJ: 0.5000 mL | INJECTION | INTRAMUSCULAR | Status: DC

## 2013-06-30 MED ORDER — PANTOPRAZOLE SODIUM 40 MG PO TBEC
40.0000 mg | DELAYED_RELEASE_TABLET | Freq: Every day | ORAL | Status: DC
Start: 2013-06-30 — End: 2013-06-30

## 2013-06-30 MED ORDER — PANTOPRAZOLE SODIUM 40 MG PO TBEC: DELAYED_RELEASE_TABLET | ORAL | Status: DC

## 2013-06-30 NOTE — Progress Notes (Signed)
   Subjective:    Patient ID: Anita Wagner, female    DOB: Nov 18, 1955, 58 y.o.   MRN: [id number]  HPI  Here today to followup on smoking cessation, history of DVT, hot flashes, anxiety. In March she was taken off  Xarelto after a 3 month course  for acute DVT. She was taken off estrogen replacement therapy immediately after being diagnosed with DVT. She's complaining bitterly of hot flashes. In March we started her on Effexor XR 75 mg daily. She took one dose and said it made her very nauseated. She took it with breakfast and not with a larger meal such as upper. She never took any more of it. Still complaining of hot flashes. Suggested that she start with one half of 75 mg dose for several days taken at supper and workup to 75 mg daily. I do not think she gave Effexor  a fair trial. She continues to smoke, unfortunately a pack of cigarettes daily. Says there's a lot of stress with her work which causes her to smoke. Offered Chantix but she says she is afraid to take that. Says brother has history of bipolar disorder and she's afraid Chantix will cause her significant side effects. Reiterated with her the importance of stopping smoking. Does have a history of anxiety. Mother has dementia and is in a nursing home and that is somewhat stressful. Says insurance coming does not want to pay for Nexium. Says they will pay for generic Protonix. Having more reflux symptoms recently.    Review of Systems     Objective:   Physical Exam  Spent 25 minutes speaking with patient about all of these issues. I'm disappointed she continues to smoke. Once again counseled regarding smoking cessation.      Assessment & Plan:  History of smoking  History of DVT  Hot flashes  History of anxiety  GE reflux-change Nexium to Protonix 40 mg twice daily. If symptoms do not improve, refer to GI physician.  Plan: Restart Effexor XR  75 mg one half  dose daily-take with largest meal which is supper for her. Cut  down on smoking gradually.

## 2013-06-30 NOTE — Patient Instructions (Addendum)
Stop Nexium.  Try one half of Effexor 75 for 2 weeks and increase to 75 mg at supper. Try Protonix twice daily for reflux. Please try to stop smoking. Diet exercise and lose weight.

## 2013-07-07 ENCOUNTER — Other Ambulatory Visit: Payer: Self-pay | Admitting: Internal Medicine

## 2013-07-07 NOTE — Telephone Encounter (Signed)
Refill x 6 months 

## 2013-07-09 ENCOUNTER — Telehealth: Payer: Self-pay | Admitting: Internal Medicine

## 2013-07-09 NOTE — Telephone Encounter (Signed)
Pt called and would like to get a refill on venlafaxine XR (EFFEXOR XR) 75 MG 24 hr capsule [[id number]] Order Details, but would prefer to get the 37 mg capsule so she will not have to split.  States that the medication is working very well.  [facility].  Best number to reach pt is [phone]

## 2013-07-10 ENCOUNTER — Other Ambulatory Visit: Payer: Self-pay

## 2013-07-10 MED ORDER — VENLAFAXINE HCL 37.5 MG PO TABS: 37.5000 mg | ORAL_TABLET | Freq: Two times a day (BID) | ORAL | Status: DC

## 2013-07-10 NOTE — Telephone Encounter (Signed)
Please refill.

## 2013-09-06 DIAGNOSIS — R232 Flushing: Secondary | ICD-10-CM | POA: Insufficient documentation

## 2013-09-06 NOTE — Progress Notes (Signed)
Subjective:    Patient ID: Anita Wagner, female    DOB: 21-Feb-1955, 58 y.o.   MRN: [id number]  HPI  58 year old White Female in today for health maintenance exam and evaluation of medical issues. She has a history of anxiety, allergic rhinitis, GE reflux, history of smoking. Not getting much exercise. In December 2014 was diagnosed with right DVT. At that time she was on estrogen replacement and smoking. She took a long car trip to the [place] part of the state and did not stop to get out and walk. Presented to the Emergency Department December 7 and was placed on Xarelto. She is now been on Xarelto for 3 months.  She is now off of estrogen replacement. However she has not stop smoking. Doesn't want to try Chantix. Says she is afraid of it.  Past medical history: Multiple drug intolerances including Augmentin, amoxicillin, sulfa, Levaquin. Is intolerant of erythromycin but can take Zithromax.  Tonsillectomy at age 69, laparoscopic surgery for endometriosis 1997, total abdominal hysterectomy BSO for endometriosis July 2004. Benign left breast needle biopsy 2004.  Social history: Patient is married. She has a high school education. Husband is a retired [employer]ister's clerk. 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 Has smoked for 20 years or more. Is down to about three fourths pack per day. One glass of wine daily. We just discussed smoking cessation many times. She's been unable to quit. No children. First marriage ended in divorce.  Family history: Father died in 36 with lung cancer. Mother with history of dementia arthritis and congestive heart failure. One brother with history of diabetes, DVT and bipolar disorder.  Tetanus immunization given in 2013.    Review of Systems  Constitutional: Positive for fatigue.  Eyes: Negative.   Respiratory:       History of smoking  Cardiovascular: Negative.   Gastrointestinal: Negative.   Endocrine: Negative.   Genitourinary: Negative.   Allergic/Immunologic:  Positive for environmental allergies.  Neurological: Negative.   Hematological:       History of right lower extremity DVT  Psychiatric/Behavioral:       Anxiety       Objective:   Physical Exam  Vitals reviewed. Constitutional: She is oriented to person, place, and time. She appears well-developed and well-nourished. No distress.  HENT:  Head: Normocephalic and atraumatic.  Right Ear: External ear normal.  Left Ear: External ear normal.  Mouth/Throat: Oropharynx is clear and moist. No oropharyngeal exudate.  Eyes: Conjunctivae and EOM are normal. Pupils are equal, round, and reactive to light. Left eye exhibits no discharge. No scleral icterus.  Neck: Neck supple. No JVD present. No thyromegaly present.  Cardiovascular: Normal rate, regular rhythm, normal heart sounds and intact distal pulses.   No murmur heard. Pulmonary/Chest: Effort normal and breath sounds normal. No respiratory distress. She has no wheezes. She has no rales. She exhibits no tenderness.  Breasts normal female without masses  Abdominal: Soft. Bowel sounds are normal. She exhibits no distension and no mass. There is no tenderness. There is no rebound and no guarding.  Genitourinary:  Status post hysterectomy and BSO  Musculoskeletal: Normal range of motion. She exhibits no edema and no tenderness.  Neurological: She is alert and oriented to person, place, and time. She has normal reflexes. No cranial nerve deficit. Coordination normal.  Skin: Skin is warm and dry. No rash noted. She is not diaphoretic.  Psychiatric: She has a normal mood and affect. Her behavior is normal. Judgment and thought content normal.  Assessment & Plan:  History of smoking. Patient should quit.  History of right lower extremity DVT treated with Xarelto. We'll now discontinue surround time and return in one month for hypercoagulable workup.  GE reflux: Aggravated by smoking and weight gain. Is taking double dose  Protonix.  History of hot flashes status post BSO. No lumbar a candidate for estrogen replacement due to DVT  History of anxiety  History of allergic rhinitis  Plan: Patient will start Effexor 75 mg XOR for hot flashes. Perhaps this will help fatigue and anxiety as well. Continue clonazepam for anxiety. Will return in 4 weeks for hypercoagulable workup. Should take aspirin 325 mg daily.  She has never had a colonoscopy. Was given 3 Hemoccult cards. Advised once again to stop smoking.

## 2013-09-23 ENCOUNTER — Other Ambulatory Visit: Payer: Self-pay | Admitting: Internal Medicine

## 2013-10-28 ENCOUNTER — Ambulatory Visit (INDEPENDENT_AMBULATORY_CARE_PROVIDER_SITE_OTHER): Payer: 59 | Admitting: Internal Medicine

## 2013-10-28 DIAGNOSIS — Z23 Encounter for immunization: Secondary | ICD-10-CM

## 2014-01-27 ENCOUNTER — Other Ambulatory Visit: Payer: Self-pay | Admitting: Internal Medicine

## 2014-01-28 ENCOUNTER — Other Ambulatory Visit: Payer: Self-pay | Admitting: *Deleted

## 2014-01-28 MED ORDER — CLONAZEPAM 0.5 MG PO TABS: ORAL_TABLET | ORAL | Status: DC

## 2014-01-28 NOTE — Telephone Encounter (Signed)
Called in script for Klonopin 0.5 mg - 60 tabs with 5 refills per Dr Renold Genta

## 2014-01-28 NOTE — Telephone Encounter (Signed)
Refill x 6 months 

## 2014-02-24 ENCOUNTER — Other Ambulatory Visit: Payer: Self-pay | Admitting: Internal Medicine

## 2014-03-28 ENCOUNTER — Other Ambulatory Visit: Payer: Self-pay | Admitting: Internal Medicine

## 2014-04-22 ENCOUNTER — Other Ambulatory Visit: Payer: Self-pay | Admitting: Internal Medicine

## 2014-04-22 NOTE — Telephone Encounter (Signed)
Patient given one refill on effexor need office visit before anymore refills

## 2014-08-21 ENCOUNTER — Other Ambulatory Visit: Payer: Self-pay | Admitting: Internal Medicine

## 2014-08-21 NOTE — Telephone Encounter (Signed)
Decline refill not seen since June 2015. Needs appt for CPE

## 2014-08-26 ENCOUNTER — Other Ambulatory Visit: Payer: Self-pay | Admitting: Internal Medicine

## 2014-08-28 ENCOUNTER — Other Ambulatory Visit: Payer: Self-pay | Admitting: *Deleted

## 2014-08-28 MED ORDER — CLONAZEPAM 0.5 MG PO TABS: ORAL_TABLET | ORAL | Status: DC

## 2014-08-28 NOTE — Telephone Encounter (Signed)
Klonopin refilled.

## 2014-09-24 ENCOUNTER — Other Ambulatory Visit: Payer: Self-pay | Admitting: Internal Medicine

## 2014-09-24 DIAGNOSIS — Z0000 Encounter for general adult medical examination without abnormal findings: Secondary | ICD-10-CM

## 2014-09-25 ENCOUNTER — Other Ambulatory Visit: Payer: 59 | Admitting: Internal Medicine

## 2014-09-25 DIAGNOSIS — Z0000 Encounter for general adult medical examination without abnormal findings: Secondary | ICD-10-CM

## 2014-09-25 LAB — CBC WITH DIFFERENTIAL/PLATELET
BASOS PCT: 1 % (ref 0–1)
Basophils Absolute: 0.1 10*3/uL (ref 0.0–0.1)
EOS ABS: 0.3 10*3/uL (ref 0.0–0.7)
Eosinophils Relative: 3 % (ref 0–5)
HCT: 39.2 % (ref 36.0–46.0)
Hemoglobin: 13.2 g/dL (ref 12.0–15.0)
Lymphocytes Relative: 27 % (ref 12–46)
Lymphs Abs: 2.3 10*3/uL (ref 0.7–4.0)
MCH: 30.9 pg (ref 26.0–34.0)
MCHC: 33.7 g/dL (ref 30.0–36.0)
MCV: 91.8 fL (ref 78.0–100.0)
MONOS PCT: 6 % (ref 3–12)
MPV: 9.8 fL (ref 8.6–12.4)
Monocytes Absolute: 0.5 10*3/uL (ref 0.1–1.0)
NEUTROS ABS: 5.3 10*3/uL (ref 1.7–7.7)
Neutrophils Relative %: 63 % (ref 43–77)
PLATELETS: 322 10*3/uL (ref 150–400)
RBC: 4.27 MIL/uL (ref 3.87–5.11)
RDW: 13.6 % (ref 11.5–15.5)
WBC: 8.4 10*3/uL (ref 4.0–10.5)

## 2014-09-25 LAB — LIPID PANEL
Cholesterol: 194 mg/dL (ref 125–200)
HDL: 48 mg/dL (ref >=46)
LDL CALC: 128 mg/dL (ref <130)
Total CHOL/HDL Ratio: 4 Ratio (ref <=5.0)
Triglycerides: 90 mg/dL (ref <150)
VLDL: 18 mg/dL (ref <30)

## 2014-09-25 LAB — COMPREHENSIVE METABOLIC PANEL
ALT: 12 U/L (ref 6–29)
AST: 14 U/L (ref 10–35)
Albumin: 4 g/dL (ref 3.6–5.1)
Alkaline Phosphatase: 67 U/L (ref 33–130)
BILIRUBIN TOTAL: 0.3 mg/dL (ref 0.2–1.2)
BUN: 14 mg/dL (ref 7–25)
CO2: 22 mmol/L (ref 20–31)
CREATININE: 0.67 mg/dL (ref 0.50–0.99)
Calcium: 8.9 mg/dL (ref 8.6–10.4)
Chloride: 107 mmol/L (ref 98–110)
Glucose, Bld: 99 mg/dL (ref 65–99)
Potassium: 4.2 mmol/L (ref 3.5–5.3)
SODIUM: 141 mmol/L (ref 135–146)
TOTAL PROTEIN: 6.7 g/dL (ref 6.1–8.1)

## 2014-09-26 LAB — TSH: TSH: 1.903 u[IU]/mL (ref 0.350–4.500)

## 2014-09-26 LAB — VITAMIN D 25 HYDROXY (VIT D DEFICIENCY, FRACTURES): VIT D 25 HYDROXY: 31 ng/mL (ref 30–100)

## 2014-09-28 ENCOUNTER — Encounter: Payer: Self-pay | Admitting: Internal Medicine

## 2014-10-01 ENCOUNTER — Ambulatory Visit (HOSPITAL_COMMUNITY)
Admission: RE | Admit: 2014-10-01 | Discharge: 2014-10-01 | Disposition: A | Discharge status: Home / Self Care | Payer: 59 | Source: Ambulatory Visit | Attending: Internal Medicine | Admitting: Internal Medicine

## 2014-10-01 ENCOUNTER — Encounter (HOSPITAL_COMMUNITY): Payer: Self-pay

## 2014-10-01 ENCOUNTER — Ambulatory Visit (INDEPENDENT_AMBULATORY_CARE_PROVIDER_SITE_OTHER): Payer: 59 | Admitting: Internal Medicine

## 2014-10-01 ENCOUNTER — Telehealth: Payer: Self-pay | Admitting: *Deleted

## 2014-10-01 ENCOUNTER — Other Ambulatory Visit: Payer: Self-pay | Admitting: Internal Medicine

## 2014-10-01 VITALS — BP 124/72 | HR 80 | Temp 98.2°F | Ht 67.0 in | Wt 203.0 lb

## 2014-10-01 DIAGNOSIS — R1010 Upper abdominal pain, unspecified: Secondary | ICD-10-CM

## 2014-10-01 DIAGNOSIS — Z0000 Encounter for general adult medical examination without abnormal findings: Secondary | ICD-10-CM | POA: Diagnosis not present

## 2014-10-01 DIAGNOSIS — Z1329 Encounter for screening for other suspected endocrine disorder: Secondary | ICD-10-CM

## 2014-10-01 DIAGNOSIS — K5730 Diverticulosis of large intestine without perforation or abscess without bleeding: Secondary | ICD-10-CM | POA: Insufficient documentation

## 2014-10-01 DIAGNOSIS — R1032 Left lower quadrant pain: Secondary | ICD-10-CM

## 2014-10-01 DIAGNOSIS — I700 Atherosclerosis of aorta: Secondary | ICD-10-CM | POA: Diagnosis not present

## 2014-10-01 DIAGNOSIS — Z1321 Encounter for screening for nutritional disorder: Secondary | ICD-10-CM | POA: Diagnosis not present

## 2014-10-01 DIAGNOSIS — R935 Abnormal findings on diagnostic imaging of other abdominal regions, including retroperitoneum: Secondary | ICD-10-CM | POA: Diagnosis not present

## 2014-10-01 DIAGNOSIS — Z130 Encounter for screening for diseases of the blood and blood-forming organs and certain disorders involving the immune mechanism: Secondary | ICD-10-CM

## 2014-10-01 LAB — POCT URINALYSIS DIPSTICK
Bilirubin, UA: NEGATIVE
Glucose, UA: NEGATIVE
Ketones, UA: NEGATIVE
Leukocytes, UA: NEGATIVE
NITRITE UA: NEGATIVE
PH UA: 6
Protein, UA: NEGATIVE
RBC UA: NEGATIVE
Spec Grav, UA: 1.01
UROBILINOGEN UA: NEGATIVE

## 2014-10-01 LAB — HEMOCCULT GUIAC POC 1CARD (OFFICE): FECAL OCCULT BLD: NEGATIVE

## 2014-10-01 MED ORDER — CIPROFLOXACIN HCL 500 MG PO TABS: 500.0000 mg | ORAL_TABLET | Freq: Two times a day (BID) | ORAL | Status: DC

## 2014-10-01 MED ORDER — IOHEXOL 300 MG/ML  SOLN
100.0000 mL | Freq: Once | INTRAMUSCULAR | Status: AC | PRN
  Administered 2014-10-01: 100 mL via INTRAVENOUS

## 2014-10-01 MED ORDER — METRONIDAZOLE 500 MG PO TABS: 500.0000 mg | ORAL_TABLET | Freq: Two times a day (BID) | ORAL | Status: DC

## 2014-10-01 NOTE — Telephone Encounter (Signed)
Spoke with patient regarding abnormal CT scan Patient instructed to be on clear liquid diet until Monday she is to be seen here in the office at 12:00 on Monday 10/05/14. Patient instructed to take 2 antibiotics sent to patient pharmacy by Dr Renold Genta. Patient verbalized understanding of instructions.

## 2014-10-02 LAB — H. PYLORI BREATH TEST: H. pylori Breath Test: NOT DETECTED

## 2014-10-03 ENCOUNTER — Encounter: Payer: Self-pay | Admitting: Internal Medicine

## 2014-10-03 NOTE — Patient Instructions (Signed)
Clear liquids until reevaluated September 26. Call if symptoms worsen. Cipro 500 mg twice daily for 10 days and Flagyl 500 mg twice daily for 7 days. Will need to have colonoscopy in the next several weeks.

## 2014-10-03 NOTE — Progress Notes (Signed)
   Subjective:    Patient ID: Anita Wagner, female    DOB: 1955-04-30, 59 y.o.   MRN: [id number]  HPI   59 year old White Female in today with complaint of abdominal pain mainly left-sided. Has been going on for several days. No fever, chills, nausea, vomiting, diarrhea. History of GE reflux but the symptoms are not consistent with reflux. No burping or water brash. She has a history of anxiety. Has never had colonoscopy. History of DVT treated with Xarelto in 2015. DVT thought to be related to smoking in [place] in her placement. She's now off faster joint replacement. Continues to smoke. History of hot flashes. Is on Protonix for GE reflux.  Laparoscopic surgery for endometriosis 1997. Total abdominal hysterectomy BSO for endometriosis July 2004.  No change in bowel habits. No blood in stool. No melanoma.  Continues to smoke three quarters of a pack cigarettes daily. Social alcohol consumption.  Social history: Is married. First marriage ended in divorce. No children. 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   Family history: Father died of lung cancer. Mother in nursing home with history of dementia and congestive heart failure. One brother with bipolar disorder    Review of Systems complains of weight gain     Objective:   Physical Exam Skin warm and dry. Nodes none. Chest clear to auscultation. Abdomen appears to be slightly distended and obese. Bowel sounds are present. No hepatosplenomegaly or masses appreciated. She is somewhat diffusely tender but mainly tender in the left abdomen with possible rebound tenderness      Assessment & Plan:  Left abdominal pain-etiology unclear. Possible diverticulitis  History of endometriosis status post total Donald hysterectomy BSO  History of DVT  History of GE reflux  Plan: H pylori breath test. CBC with differential. CT of abdomen and pelvis  Addendum: Phone call from radiologist. Patient has thickening in colonic wall area of  splenic flexure that she is concerned about for possible colonic mass. Patient also has apparent acute diverticulitis. Patient has diverticulosis. Patient will be placed on Cipro 500 mg twice daily for 10 days and Flagyl 500 mg twice daily for 7 days. She will stay on clear liquid diet will be reevaluated on Monday, September 26. She will need colonoscopy but will need to wait 4-6 weeks for healing of diverticulitis.

## 2014-10-05 ENCOUNTER — Encounter: Payer: Self-pay | Admitting: Internal Medicine

## 2014-10-05 ENCOUNTER — Ambulatory Visit (INDEPENDENT_AMBULATORY_CARE_PROVIDER_SITE_OTHER): Payer: 59 | Admitting: Internal Medicine

## 2014-10-05 VITALS — BP 120/74 | HR 83 | Temp 97.9°F | Wt 200.0 lb

## 2014-10-05 DIAGNOSIS — E669 Obesity, unspecified: Secondary | ICD-10-CM

## 2014-10-05 DIAGNOSIS — Z720 Tobacco use: Secondary | ICD-10-CM | POA: Diagnosis not present

## 2014-10-05 DIAGNOSIS — I700 Atherosclerosis of aorta: Secondary | ICD-10-CM

## 2014-10-05 DIAGNOSIS — K5792 Diverticulitis of intestine, part unspecified, without perforation or abscess without bleeding: Secondary | ICD-10-CM

## 2014-10-05 DIAGNOSIS — Z87891 Personal history of nicotine dependence: Secondary | ICD-10-CM

## 2014-10-05 NOTE — Patient Instructions (Signed)
Advance to soft diet for the next 10 days to 2 weeks and then regular diet. Colonoscopy in 4-6 weeks. Please quit smoking. Return in October for physical exam.

## 2014-10-05 NOTE — Progress Notes (Signed)
   Subjective:    Patient ID: Anita Wagner, female    DOB: [birth year], 59 y.o.   MRN: [id number]  HPI  59 year old Female in today to follow-up on acute sigmoid diverticulitis. She is feeling better after starting antibody. Less of abdominal pain. Has been on clear liquids since diagnosis. Wants to advance diet.  She had fasting lab work recently in anticipation of a physical exam in October. Results given to her today and reviewed. CT scan reviewed with her as well. She has aortic atherosclerosis. Recommended once again that she quit smoking. Needs to diet exercise and lose weight. There is also some concern for a thickening in the wall of colon at the splenic flexure which could be part of the diverticulitis or a mass. She has never had a colonoscopy. She will have colonoscopy in 4-6 weeks.    Review of Systems     Objective:   Physical Exam  Abdomen: Less distention. Bowel sounds are active. Abdomen is soft and obese without hepatosplenomegaly masses or tenderness today.      Assessment & Plan:  Acute diverticulitis sigmoid colon  Abnormality splenic flexure of colon  Plan: Complete course of 8 biotics. Advance to soft diet for the next 2 weeks and then regular diet. Follow-up colonoscopy in 4-6 weeks. Physical exam is upcoming in October and had been previously scheduled. Smoking cessation counseling.  25 minutes spent with patient reviewing lab results, reviewing diagnosis of diverticulitis, diet management, smoking cessation counseling

## 2014-10-08 ENCOUNTER — Other Ambulatory Visit: Payer: Self-pay | Admitting: Internal Medicine

## 2014-10-09 ENCOUNTER — Other Ambulatory Visit: Payer: Self-pay | Admitting: *Deleted

## 2014-10-09 ENCOUNTER — Encounter: Payer: Self-pay | Admitting: Gastroenterology

## 2014-10-09 MED ORDER — DEXLANSOPRAZOLE 60 MG PO CPDR: 60.0000 mg | DELAYED_RELEASE_CAPSULE | Freq: Every day | ORAL | Status: DC

## 2014-10-09 NOTE — Telephone Encounter (Signed)
Changed Nexium to Dexilant per Dr Renold Genta. Patients insurance would not cover nexium

## 2014-10-15 ENCOUNTER — Other Ambulatory Visit: Payer: Self-pay | Admitting: Internal Medicine

## 2014-10-16 NOTE — Telephone Encounter (Signed)
Refill 3 months

## 2014-10-26 ENCOUNTER — Ambulatory Visit (INDEPENDENT_AMBULATORY_CARE_PROVIDER_SITE_OTHER): Payer: 59 | Admitting: Internal Medicine

## 2014-10-26 ENCOUNTER — Encounter: Payer: Self-pay | Admitting: Internal Medicine

## 2014-10-26 VITALS — BP 120/72 | HR 72 | Temp 97.4°F | Ht 66.0 in | Wt 198.0 lb

## 2014-10-26 DIAGNOSIS — F411 Generalized anxiety disorder: Secondary | ICD-10-CM

## 2014-10-26 DIAGNOSIS — E669 Obesity, unspecified: Secondary | ICD-10-CM

## 2014-10-26 DIAGNOSIS — Z0000 Encounter for general adult medical examination without abnormal findings: Secondary | ICD-10-CM | POA: Diagnosis not present

## 2014-10-26 DIAGNOSIS — Z23 Encounter for immunization: Secondary | ICD-10-CM | POA: Diagnosis not present

## 2014-10-26 DIAGNOSIS — Z87891 Personal history of nicotine dependence: Secondary | ICD-10-CM

## 2014-10-26 DIAGNOSIS — K5732 Diverticulitis of large intestine without perforation or abscess without bleeding: Secondary | ICD-10-CM | POA: Diagnosis not present

## 2014-10-26 DIAGNOSIS — Z720 Tobacco use: Secondary | ICD-10-CM

## 2014-10-26 DIAGNOSIS — Z86718 Personal history of other venous thrombosis and embolism: Secondary | ICD-10-CM | POA: Diagnosis not present

## 2014-10-26 DIAGNOSIS — K219 Gastro-esophageal reflux disease without esophagitis: Secondary | ICD-10-CM | POA: Diagnosis not present

## 2014-10-26 LAB — POCT URINALYSIS DIPSTICK
Bilirubin, UA: NEGATIVE
Blood, UA: NEGATIVE
GLUCOSE UA: NEGATIVE
KETONES UA: NEGATIVE
LEUKOCYTES UA: NEGATIVE
Nitrite, UA: NEGATIVE
PROTEIN UA: NEGATIVE
Spec Grav, UA: 1.01
UROBILINOGEN UA: NEGATIVE
pH, UA: 5

## 2014-11-08 NOTE — Patient Instructions (Signed)
We are glad diverticulitis has improved. Please proceed with colonoscopy. Continue same medications and return in 6 months.

## 2014-11-08 NOTE — Progress Notes (Signed)
Subjective:    Patient ID: Anita Wagner, female    DOB: 04-08-1955, 59 y.o.   MRN: [id number]  HPI 59 year old White Female in today for health maintenance exam and evaluation of medical issues. Recently was diagnosed with acute diverticulitis on September 22. Was treated with Cipro and Flagyl and has improved. She has never had a colonoscopy but is scheduled to have one in the near future. Radiologist was concerned about thickening in the splenic flexure. Patient is unhappy with her weight. Cannot seem to lose weight but doesn't exercise like she used to years ago. Has chronic hot flashes. History of right lower extremity DVT in December 2015 related to taking a long trip, estrogen replacement and smoking. Continues to smoke unfortunately. Agrees to try Chantix.  Multiple drug intolerances including Augmentin, amoxicillin, sulfa, Levaquin. Intolerant of erythromycin begin takes Zithromax.  History of GE reflux and allergic rhinitis.  Tonsillectomy at age 59, laparoscopic surgery for endometriosis 1997, total abdominal hysterectomy BSO for endometriosis July 2004  Tetanus immunization update given in 2013  Benign left needle biopsy 2004  Family history: Mother with history of dementia congestive heart failure and arthritis is in a nursing home. One brother with bipolar disorder otherwise good health. Father died in 60 with lung cancer.  Social history: She has a high school education. Husband is a retired Barrister's clerk. 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 One glass of wine daily. No children. First marriage ended in divorce.    Review of Systems  Constitutional: Positive for fatigue.  Respiratory: Negative.   Cardiovascular: Negative.   Genitourinary:       Hot flashes  Neurological: Negative.   Hematological: Negative.   Psychiatric/Behavioral:       Anxiety       Objective:   Physical Exam  Constitutional: She is  oriented to person, place, and time. She appears well-developed and well-nourished. No distress.  HENT:  Head: Normocephalic and atraumatic.  Right Ear: External ear normal.  Left Ear: External ear normal.  Mouth/Throat: Oropharynx is clear and moist. No oropharyngeal exudate.  Eyes: Conjunctivae and EOM are normal. Pupils are equal, round, and reactive to light. Right eye exhibits no discharge. Left eye exhibits no discharge. No scleral icterus.  Neck: Neck supple. No JVD present. No thyromegaly present.  Cardiovascular: Normal rate, regular rhythm and normal heart sounds.   No murmur heard. Pulmonary/Chest: Effort normal and breath sounds normal. She has no wheezes. She has no rales.  Breasts normal female  Abdominal: Soft. Bowel sounds are normal. She exhibits no distension and no mass. There is no tenderness. There is no rebound and no guarding.  Genitourinary:  Deferred status post TAH/BSO for endometriosis  Musculoskeletal: Normal range of motion. She exhibits no edema.  Neurological: She is alert and oriented to person, place, and time. She has normal reflexes.  Skin: Skin is warm and dry. No rash noted. She is not diaphoretic.  Psychiatric: She has a normal mood and affect. Her behavior is normal. Judgment and thought content normal.  Vitals reviewed.         Assessment & Plan:  Recent diagnosis of diverticulitis-improved. Have colonoscopy in the next few weeks  History of smoking-given Chantix starter pack  Anxiety-takes antianxiety medication  Hot flashes cannot take estrogen replacement due to history of DVT-  History of right leg DVT 2014 without recurrence after estrogen replacement stopped but continues to smoke  Question thickening  in splenic flexure to have colonoscopy in the near future.  Has never had colonoscop  GE reflux treated with PPI  Plan: Lab work reviewed and is within normal limits. She may return in 6 months for follow-up on anxiety or as  needed.

## 2014-11-18 ENCOUNTER — Ambulatory Visit (AMBULATORY_SURGERY_CENTER): Payer: Self-pay | Admitting: *Deleted

## 2014-11-18 VITALS — Ht 66.0 in | Wt 200.6 lb

## 2014-11-18 DIAGNOSIS — Z1211 Encounter for screening for malignant neoplasm of colon: Secondary | ICD-10-CM

## 2014-11-18 MED ORDER — NA SULFATE-K SULFATE-MG SULF 17.5-3.13-1.6 GM/177ML PO SOLN: ORAL | Status: DC

## 2014-11-18 NOTE — Progress Notes (Signed)
No egg or soy allergy  No anesthesia or intubation problems per pt  No diet medications taken  Registered in EMMI   

## 2014-11-30 ENCOUNTER — Encounter: Payer: 59 | Admitting: Gastroenterology

## 2014-12-15 ENCOUNTER — Ambulatory Visit (AMBULATORY_SURGERY_CENTER): Payer: 59 | Admitting: Gastroenterology

## 2014-12-15 ENCOUNTER — Encounter: Payer: Self-pay | Admitting: Gastroenterology

## 2014-12-15 VITALS — BP 118/60 | HR 69 | Temp 98.4°F | Resp 18 | Ht 66.0 in | Wt 200.0 lb

## 2014-12-15 DIAGNOSIS — Z1211 Encounter for screening for malignant neoplasm of colon: Secondary | ICD-10-CM

## 2014-12-15 DIAGNOSIS — D122 Benign neoplasm of ascending colon: Secondary | ICD-10-CM

## 2014-12-15 MED ORDER — SODIUM CHLORIDE 0.9 % IV SOLN: 500.0000 mL | INTRAVENOUS | Status: DC

## 2014-12-15 NOTE — Progress Notes (Signed)
Patient awakening,vss,report to rn 

## 2014-12-15 NOTE — Progress Notes (Signed)
Called to room to assist during endoscopic procedure.  Patient ID and intended procedure confirmed with present staff. Received instructions for my participation in the procedure from the performing physician.  

## 2014-12-15 NOTE — Op Note (Signed)
[place]  [address] [place], [id number]   COLONOSCOPY PROCEDURE REPORT  PATIENT: Anita Wagner, Anita Wagner  MR#: [id number] BIRTHDATE: 28-Jul-1955 , 59  yrs. old GENDER: female ENDOSCOPIST: Milus Banister, MD REFERRED [id number] Parke Simmers, M.D. PROCEDURE DATE:  12/15/2014 PROCEDURE:   Colonoscopy, screening and Colonoscopy with snare polypectomy First Screening Colonoscopy - Avg.  risk and is 50 yrs.  old or older Yes.  Prior Negative Screening - Now for repeat screening. N/A  History of Adenoma - Now for follow-up colonoscopy & has been > or = to 3 yrs.  N/A  Polyps removed today? Yes ASA CLASS:   Class II INDICATIONS:Screening for colonic neoplasia. MEDICATIONS: Monitored anesthesia care and Propofol 250 mg IV  DESCRIPTION OF PROCEDURE:   After the risks benefits and alternatives of the procedure were thoroughly explained, informed consent was obtained.  The digital rectal exam revealed no abnormalities of the rectum.   The LB [id number]  endoscope was introduced through the anus and advanced to the cecum, which was identified by both the appendix and ileocecal valve. No adverse events experienced.   The quality of the prep was excellent.  The instrument was then slowly withdrawn as the colon was fully examined. Estimated blood loss is zero unless otherwise noted in this procedure report.  COLON FINDINGS: A sessile polyp measuring 7 mm in size was found in the ascending colon.  A polypectomy was performed with a cold snare.  The resection was complete, the polyp tissue was completely retrieved and sent to histology.   There was mild diverticulosis noted in the left colon.   The examination was otherwise normal. Retroflexed views revealed no abnormalities. The time to cecum = 3.7 Withdrawal time = 6.8   The scope was withdrawn and the procedure completed. COMPLICATIONS: There were no immediate complications.  ENDOSCOPIC IMPRESSION: 1.   Sessile polyp  was found in the ascending colon; polypectomy was performed with a cold snare 2.   Mild diverticulosis was noted in the left colon 3.   The examination was otherwise normal  RECOMMENDATIONS: If the polyp(s) removed today are proven to be adenomatous (pre-cancerous) polyps, you will need a repeat colonoscopy in 5 years.  Otherwise you should continue to follow colorectal cancer screening guidelines for "routine risk" patients with colonoscopy in 10 years.  You will receive a letter within 1-2 weeks with the results of your biopsy as well as final recommendations.  Please call my office if you have not received a letter after 3 weeks.  eSigned:  Milus Banister, MD 12/15/2014 7:50 AM

## 2014-12-15 NOTE — Patient Instructions (Signed)
YOU HAD AN ENDOSCOPIC PROCEDURE TODAY AT [facility]:   Refer to the procedure report that was given to you for any specific questions about what was found during the examination.  If the procedure report does not answer your questions, please call your gastroenterologist to clarify.  If you requested that your care partner not be given the details of your procedure findings, then the procedure report has been included in a sealed envelope for you to review at your convenience later.  YOU SHOULD EXPECT: Some feelings of bloating in the abdomen. Passage of more gas than usual.  Walking can help get rid of the air that was put into your GI tract during the procedure and reduce the bloating. If you had a lower endoscopy (such as a colonoscopy or flexible sigmoidoscopy) you may notice spotting of blood in your stool or on the toilet paper. If you underwent a bowel prep for your procedure, you may not have a normal bowel movement for a few days.  Please Note:  You might notice some irritation and congestion in your nose or some drainage.  This is from the oxygen used during your procedure.  There is no need for concern and it should clear up in a day or so.  SYMPTOMS TO REPORT IMMEDIATELY:   Following lower endoscopy (colonoscopy or flexible sigmoidoscopy):  Excessive amounts of blood in the stool  Significant tenderness or worsening of abdominal pains  Swelling of the abdomen that is new, acute  Fever of 100F or higher   For urgent or emergent issues, a gastroenterologist can be reached at any hour by calling [phone].   DIET: Your first meal following the procedure should be a small meal and then it is ok to progress to your normal diet. Heavy or fried foods are harder to digest and may make you feel nauseous or bloated.  Likewise, meals heavy in dairy and vegetables can increase bloating.  Drink plenty of fluids but you should avoid alcoholic beverages for 24 hours.Try to  increase the fiber in your diet to help to prevent Divertiulitis.  ACTIVITY:  You should plan to take it easy for the rest of today and you should NOT DRIVE or use heavy machinery until tomorrow (because of the sedation medicines used during the test).    FOLLOW UP: Our staff will call the number listed on your records the next business day following your procedure to check on you and address any questions or concerns that you may have regarding the information given to you following your procedure. If we do not reach you, we will leave a message.  However, if you are feeling well and you are not experiencing any problems, there is no need to return our call.  We will assume that you have returned to your regular daily activities without incident.  If any biopsies were taken you will be contacted by phone or by letter within the next 1-3 weeks.  Please call us at [phone] if you have not heard about the biopsies in 3 weeks.    SIGNATURES/CONFIDENTIALITY: You and/or your care partner have signed paperwork which will be entered into your electronic medical record.  These signatures attest to the fact that that the information above on your After Visit Summary has been reviewed and is understood.  Full responsibility of the confidentiality of this discharge information lies with you and/or your care-partner.  Read all handouts given to you by your recovery room nurse.

## 2014-12-16 ENCOUNTER — Telehealth: Payer: Self-pay | Admitting: *Deleted

## 2014-12-16 NOTE — Telephone Encounter (Signed)
  Follow up Call-  Call back number 12/15/2014  Post procedure Call Back phone  # [phone]  Permission to leave phone message Yes     Patient questions:  Do you have a fever, pain , or abdominal swelling? No. Pain Score  0 *  Have you tolerated food without any problems? Yes.    Have you been able to return to your normal activities? Yes.    Do you have any questions about your discharge instructions: Diet   No. Medications  No. Follow up visit  No.  Do you have questions or concerns about your Care? No.  Actions: * If pain score is 4 or above: No action needed, pain <4.

## 2014-12-23 ENCOUNTER — Encounter: Payer: Self-pay | Admitting: Gastroenterology

## 2015-01-07 ENCOUNTER — Other Ambulatory Visit: Payer: Self-pay | Admitting: Internal Medicine

## 2015-03-07 ENCOUNTER — Other Ambulatory Visit: Payer: Self-pay | Admitting: Internal Medicine

## 2015-03-07 NOTE — Telephone Encounter (Signed)
Refill through April-- has 6 month follow up then

## 2015-03-08 ENCOUNTER — Encounter: Payer: Self-pay | Admitting: Internal Medicine

## 2015-03-08 ENCOUNTER — Ambulatory Visit (INDEPENDENT_AMBULATORY_CARE_PROVIDER_SITE_OTHER): Payer: 59 | Admitting: Internal Medicine

## 2015-03-08 VITALS — BP 136/72 | HR 83 | Temp 97.2°F | Resp 20 | Wt 202.5 lb

## 2015-03-08 DIAGNOSIS — F439 Reaction to severe stress, unspecified: Secondary | ICD-10-CM

## 2015-03-08 DIAGNOSIS — Z720 Tobacco use: Secondary | ICD-10-CM | POA: Diagnosis not present

## 2015-03-08 DIAGNOSIS — R232 Flushing: Secondary | ICD-10-CM

## 2015-03-08 DIAGNOSIS — Z658 Other specified problems related to psychosocial circumstances: Secondary | ICD-10-CM

## 2015-03-08 DIAGNOSIS — E669 Obesity, unspecified: Secondary | ICD-10-CM | POA: Diagnosis not present

## 2015-03-08 DIAGNOSIS — Z87891 Personal history of nicotine dependence: Secondary | ICD-10-CM

## 2015-03-08 DIAGNOSIS — F411 Generalized anxiety disorder: Secondary | ICD-10-CM | POA: Diagnosis not present

## 2015-03-08 DIAGNOSIS — Z86718 Personal history of other venous thrombosis and embolism: Secondary | ICD-10-CM | POA: Diagnosis not present

## 2015-03-08 DIAGNOSIS — N951 Menopausal and female climacteric states: Secondary | ICD-10-CM | POA: Diagnosis not present

## 2015-03-08 MED ORDER — NALTREXONE-BUPROPION HCL ER 8-90 MG PO TB12: ORAL_TABLET | ORAL | Status: DC

## 2015-03-08 NOTE — Patient Instructions (Signed)
Trial of Contrave. RTC 4 weeks. Make better food choices. Try to exercise 20 minutes everyday. Watch wine consumption. Consider counseling to help with strees and weight management.

## 2015-03-08 NOTE — Telephone Encounter (Signed)
Phoned to pharmacy 

## 2015-03-08 NOTE — Progress Notes (Signed)
   Subjective:    Patient ID: Anita Wagner, female    DOB: [birth year], 60 y.o.   MRN: [id number]  HPI  60 year old Female in today to discuss weight management. A friend of hers told her about phentermine. Patient did not know phentermine was an amphetamine. Talked with her about side effects including insomnia. She continues to smoke. She's not itched it and phentermine after hearing about possible side effects.  Continues to have significant situational stress. 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 Has to drive out of town to these complexes. Mother remains a nursing home and is getting dementia. Husband has had Cardiology issues and recently had virus with dehydration. Patient does not feel that she can retire just yet.  Doesn't really want to consider counseling but actually needs a health coach tick help her get motivated to diet and exercise. She eats a scrambled egg every morning for breakfast. Eats [employer] with fries for lunch. Usually tries to get grilled chicken at [employer] and chips. Drinks wine. Has a treadmill but doesn't use it very much. Spends a lot of time sitting at the computer and driving in the car. Remote history of DVT when was on estrogen replacement. She continues to smoke unfortunately. Continues to have issues with hot flashes. Cannot be on estrogen replacement due to history of DVT. Taking Klonopin twice daily for anxiety.     Review of Systems       Objective:   Physical Exam   Spent 20 minutes speaking with her and coaching her about all of these issues. I do want her to consider counseling. She is eating to alleviate stress. She smokes to alleviate stress. These are not healthy habits. She's gained some weight.  Pt. Weighs 202.5 pounds today and was 198 pounds October 2016.     Assessment & Plan:   Obesity  History of DVT  Situational stress   History of smoking   Plan: Trial of Contrave.90mg /8 mg. RTC in 4  weeks for followup Try to exercise at least 20 minutes daily. Make better food choices. Consider counseling.Decrease wine consumption.

## 2015-04-27 ENCOUNTER — Telehealth: Payer: Self-pay | Admitting: Internal Medicine

## 2015-04-27 NOTE — Telephone Encounter (Signed)
Patient called to cancel her appointment this morning on 4/20 @ 3:45; 4 week f/u on Contrave.  Patient states that she had the medication filled.  However, she never started taking the medication.  She wanted to stay off of medication if at all possible.  She has joined [employer].  She is walking every other day about 20-30 minutes.  She has lost 5 pounds in the last 2 weeks.  States she joined 3 weeks ago.  She wants to have time to allow [employer] to work for her prior to trying a prescribed medication such as Contrave.  Therefore, she's cancelling her appointment for 4/20.

## 2015-04-29 ENCOUNTER — Ambulatory Visit: Payer: 59 | Admitting: Internal Medicine

## 2015-07-09 ENCOUNTER — Other Ambulatory Visit: Payer: Self-pay | Admitting: Internal Medicine

## 2015-07-09 NOTE — Telephone Encounter (Signed)
6 month recheck due for Klonopin in August. Please call her and make appt. OK to refill until appt.

## 2015-08-16 ENCOUNTER — Encounter: Payer: Self-pay | Admitting: Internal Medicine

## 2015-08-16 ENCOUNTER — Telehealth: Payer: Self-pay | Admitting: Internal Medicine

## 2015-08-16 ENCOUNTER — Other Ambulatory Visit: Payer: Self-pay | Admitting: Internal Medicine

## 2015-08-16 NOTE — Telephone Encounter (Signed)
Refill Klonopin until October 30

## 2015-08-16 NOTE — Telephone Encounter (Signed)
Patient called and we made her CPE for October 20 and CPE Labs for 10/16.  Can we now call in her Klonopin to her pharmacy?

## 2015-08-17 MED ORDER — CLONAZEPAM 0.5 MG PO TABS: ORAL_TABLET | ORAL | 1 refills | Status: DC

## 2015-09-21 ENCOUNTER — Other Ambulatory Visit: Payer: Self-pay | Admitting: Internal Medicine

## 2015-09-21 NOTE — Telephone Encounter (Signed)
Refill x 60 days only

## 2015-09-21 NOTE — Telephone Encounter (Signed)
Dr Renold Genta, patient has been scheduled for CPE Labs on 10/16 and CPE for 10/20 @ 3pm.

## 2015-09-21 NOTE — Telephone Encounter (Signed)
Refill x 60 days only --has PE October.

## 2015-10-01 NOTE — Telephone Encounter (Signed)
Patient has CPE Labs scheduled for 10/16 @ 9:00 a.m. CPE scheduled for 10/20 @ 3:00 p.m.

## 2015-10-25 ENCOUNTER — Other Ambulatory Visit: Payer: 59 | Admitting: Internal Medicine

## 2015-10-25 DIAGNOSIS — Z0000 Encounter for general adult medical examination without abnormal findings: Secondary | ICD-10-CM

## 2015-10-25 LAB — COMPREHENSIVE METABOLIC PANEL
ALK PHOS: 56 U/L (ref 33–130)
ALT: 11 U/L (ref 6–29)
AST: 14 U/L (ref 10–35)
Albumin: 3.9 g/dL (ref 3.6–5.1)
BUN: 15 mg/dL (ref 7–25)
CHLORIDE: 106 mmol/L (ref 98–110)
CO2: 25 mmol/L (ref 20–31)
Calcium: 9.2 mg/dL (ref 8.6–10.4)
Creat: 0.68 mg/dL (ref 0.50–0.99)
GLUCOSE: 95 mg/dL (ref 65–99)
POTASSIUM: 4.4 mmol/L (ref 3.5–5.3)
Sodium: 140 mmol/L (ref 135–146)
Total Bilirubin: 0.4 mg/dL (ref 0.2–1.2)
Total Protein: 7 g/dL (ref 6.1–8.1)

## 2015-10-25 LAB — CBC WITH DIFFERENTIAL/PLATELET
BASOS PCT: 1 %
Basophils Absolute: 63 cells/uL (ref 0–200)
EOS ABS: 189 {cells}/uL (ref 15–500)
EOS PCT: 3 %
HCT: 41.4 % (ref 35.0–45.0)
Hemoglobin: 13.7 g/dL (ref 11.7–15.5)
LYMPHS PCT: 29 %
Lymphs Abs: 1827 cells/uL (ref 850–3900)
MCH: 31.4 pg (ref 27.0–33.0)
MCHC: 33.1 g/dL (ref 32.0–36.0)
MCV: 94.7 fL (ref 80.0–100.0)
MONOS PCT: 6 %
MPV: 10.1 fL (ref 7.5–12.5)
Monocytes Absolute: 378 cells/uL (ref 200–950)
Neutro Abs: 3843 cells/uL (ref 1500–7800)
Neutrophils Relative %: 61 %
PLATELETS: 235 10*3/uL (ref 140–400)
RBC: 4.37 MIL/uL (ref 3.80–5.10)
RDW: 13.3 % (ref 11.0–15.0)
WBC: 6.3 10*3/uL (ref 3.8–10.8)

## 2015-10-25 LAB — LIPID PANEL
CHOL/HDL RATIO: 3.9 ratio (ref <=5.0)
Cholesterol: 213 mg/dL — ABNORMAL HIGH (ref 125–200)
HDL: 55 mg/dL (ref >=46)
LDL Cholesterol: 136 mg/dL — ABNORMAL HIGH (ref <130)
Triglycerides: 110 mg/dL (ref <150)
VLDL: 22 mg/dL (ref <30)

## 2015-10-25 LAB — TSH: TSH: 2.47 m[IU]/L

## 2015-10-26 LAB — VITAMIN D 25 HYDROXY (VIT D DEFICIENCY, FRACTURES): VIT D 25 HYDROXY: 32 ng/mL (ref 30–100)

## 2015-10-29 ENCOUNTER — Other Ambulatory Visit: Payer: Self-pay

## 2015-10-29 ENCOUNTER — Ambulatory Visit (INDEPENDENT_AMBULATORY_CARE_PROVIDER_SITE_OTHER): Payer: 59 | Admitting: Internal Medicine

## 2015-10-29 ENCOUNTER — Encounter: Payer: Self-pay | Admitting: Internal Medicine

## 2015-10-29 VITALS — BP 122/68 | HR 80 | Ht 66.5 in | Wt 186.0 lb

## 2015-10-29 DIAGNOSIS — Z86718 Personal history of other venous thrombosis and embolism: Secondary | ICD-10-CM

## 2015-10-29 DIAGNOSIS — K219 Gastro-esophageal reflux disease without esophagitis: Secondary | ICD-10-CM

## 2015-10-29 DIAGNOSIS — Z23 Encounter for immunization: Secondary | ICD-10-CM

## 2015-10-29 DIAGNOSIS — R232 Flushing: Secondary | ICD-10-CM

## 2015-10-29 DIAGNOSIS — Z87891 Personal history of nicotine dependence: Secondary | ICD-10-CM

## 2015-10-29 DIAGNOSIS — F439 Reaction to severe stress, unspecified: Secondary | ICD-10-CM

## 2015-10-29 DIAGNOSIS — F411 Generalized anxiety disorder: Secondary | ICD-10-CM

## 2015-10-29 DIAGNOSIS — Z0000 Encounter for general adult medical examination without abnormal findings: Secondary | ICD-10-CM | POA: Diagnosis not present

## 2015-10-29 LAB — POC URINALSYSI DIPSTICK (AUTOMATED)
Bilirubin, UA: NEGATIVE
Blood, UA: NEGATIVE
Glucose, UA: NEGATIVE
KETONES UA: NEGATIVE
LEUKOCYTES UA: NEGATIVE
Nitrite, UA: NEGATIVE
Protein, UA: NEGATIVE
SPEC GRAV UA: 1.01
UROBILINOGEN UA: NEGATIVE
pH, UA: 6

## 2015-10-29 MED ORDER — CLONAZEPAM 0.5 MG PO TABS: 0.5000 mg | ORAL_TABLET | Freq: Two times a day (BID) | ORAL | 2 refills | Status: DC | PRN

## 2015-10-30 ENCOUNTER — Other Ambulatory Visit: Payer: Self-pay | Admitting: Internal Medicine

## 2015-10-30 DIAGNOSIS — Z1231 Encounter for screening mammogram for malignant neoplasm of breast: Secondary | ICD-10-CM

## 2015-11-06 NOTE — Progress Notes (Signed)
   Subjective:    Patient ID: Anita Wagner, female    DOB: [birth year], 60 y.o.   MRN: [id number]  HPI 60 year old Female in today for health maintenance exam and evaluation of medical issues. She has long-standing history of anxiety treated with Klonopin.  History of acute diverticulitis September 2016. Was treated with Cipro and Flagyl.  Continues to have issues with her weight.  History of right lower extremity DVT December 2015 relating to taking a long trip estrogen replacement and smoking. Continues to smoke unfortunately.  Multiple drug intolerances including Augmentin, amoxicillin, sulfa, Levaquin. Intolerant of erythromycin but can take Zithromax.  History of GE reflux and allergic rhinitis.  Tonsillectomy at age 23, laparoscopic surgery for endometriosis 1997, total abdominal hysterectomy BSO for endometriosis July 2004.  Tetanus immunization update 2013. Benign left needle biopsy 2004.  Social history: She has a high school education. Husband is retired Barrister's clerk. 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 She has smoked for well over 20 years and smokes about a pack a day. One glass of wine daily. No children. First marriage ended in divorce.  Family history: Mother with history of dementia, congestive heart failure, arthritis is in a nursing home. One brother with bipolar disorder otherwise good health. Father died in 22 with lung cancer.    Review of Systems  Constitutional: Negative.   All other systems reviewed and are negative.      Objective:   Physical Exam  Constitutional: She is oriented to person, place, and time. She appears well-developed and well-nourished. No distress.  HENT:  Head: Normocephalic and atraumatic.  Right Ear: External ear normal.  Left Ear: External ear normal.  Mouth/Throat: Oropharynx is clear and moist.  Eyes: Conjunctivae and EOM are normal. Pupils are equal, round, and reactive to light. Right eye exhibits no discharge. Left eye exhibits no  discharge. No scleral icterus.  Neck: Neck supple. No JVD present. No thyromegaly present.  Cardiovascular: Normal rate, regular rhythm and normal heart sounds.   No murmur heard. Pulmonary/Chest: Effort normal and breath sounds normal. No respiratory distress. She has no wheezes. She has no rales.  Abdominal: Bowel sounds are normal. She exhibits no distension and no mass. There is no tenderness. There is no rebound and no guarding.  Genitourinary:  Genitourinary Comments: Deferred status post TAH/BSO for endometriosis  Lymphadenopathy:    She has no cervical adenopathy.  Neurological: She is alert and oriented to person, place, and time. She has normal reflexes. She displays normal reflexes. No cranial nerve deficit. Coordination normal.  Skin: Skin is warm and dry. No rash noted. She is not diaphoretic.  Psychiatric: She has a normal mood and affect. Her behavior is normal. Judgment and thought content normal.  Vitals reviewed.         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 Continue diet exercise and weight loss efforts. Please stop smoking.

## 2015-11-08 NOTE — Patient Instructions (Signed)
Continue same medications and return in 6 months. Please stop smoking. Please work on diet exercise and weight loss.

## 2015-11-30 ENCOUNTER — Ambulatory Visit
Admission: RE | Admit: 2015-11-30 | Discharge: 2015-11-30 | Disposition: A | Discharge status: Home / Self Care | Payer: 59 | Source: Ambulatory Visit | Attending: Internal Medicine | Admitting: Internal Medicine

## 2015-11-30 DIAGNOSIS — Z1231 Encounter for screening mammogram for malignant neoplasm of breast: Secondary | ICD-10-CM

## 2016-01-02 ENCOUNTER — Other Ambulatory Visit: Payer: Self-pay | Admitting: Internal Medicine

## 2016-02-25 ENCOUNTER — Ambulatory Visit (INDEPENDENT_AMBULATORY_CARE_PROVIDER_SITE_OTHER): Payer: 59 | Admitting: Internal Medicine

## 2016-02-25 VITALS — BP 122/62 | HR 108 | Temp 100.6°F | Wt 191.0 lb

## 2016-02-25 DIAGNOSIS — J111 Influenza due to unidentified influenza virus with other respiratory manifestations: Secondary | ICD-10-CM | POA: Diagnosis not present

## 2016-02-25 MED ORDER — AZITHROMYCIN 250 MG PO TABS: ORAL_TABLET | ORAL | 1 refills | Status: DC

## 2016-02-25 MED ORDER — OSELTAMIVIR PHOSPHATE 75 MG PO CAPS: 75.0000 mg | ORAL_CAPSULE | Freq: Two times a day (BID) | ORAL | 0 refills | Status: DC

## 2016-02-25 MED ORDER — HYDROCODONE-HOMATROPINE 5-1.5 MG/5ML PO SYRP: 5.0000 mL | ORAL_SOLUTION | Freq: Three times a day (TID) | ORAL | 0 refills | Status: DC | PRN

## 2016-02-25 NOTE — Progress Notes (Signed)
   Subjective:    Patient ID: William Dalton, female    DOB: 07/22/1955, 61 y.o.   MRN: [id number]  HPI 61 year old Female in today with cough chills and myalgias. She has low-grade fever 100.6 orally. Pulse is 108. No nausea vomiting or diarrhea. Has congested cough.  Patient recently quit her job and is looking for new position.    Review of Systems see above     Objective:   Physical Exam Looks fatigued. Skin warm and dry. TMs are clear. Pharynx very slightly injected. Neck is supple without adenopathy. Chest clear to auscultation.       Assessment & Plan:  Influenza  Plan: Hycodan 1 teaspoon by mouth every 8 hours when necessary cough. Zithromax Z-PAK take 2 tablets day one followed by 1 tablet days 2 through 5. Tamiflu 75 mg twice daily for 5 days. Tylenol or Advil as needed for myalgias and fever.

## 2016-03-07 ENCOUNTER — Encounter: Payer: Self-pay | Admitting: Internal Medicine

## 2016-03-07 NOTE — Patient Instructions (Signed)
Tamiflu 75 mg twice daily for 5 days. Hycodan 1 teaspoon by mouth every 8 hours when necessary cough. Zithromax Z-PAK take as directed. Tylenol or Advil as needed for fever and myalgias. Rest and drink plenty of fluids.

## 2016-03-17 ENCOUNTER — Telehealth: Payer: Self-pay | Admitting: Internal Medicine

## 2016-03-17 NOTE — Telephone Encounter (Signed)
Patient states that she starts a new job on Monday.  States that she is mostly over the flu from when she was in on 2/16.  She has the refill left on the Z-pak that you gave her.  States that she still has the terrible cough and it's mostly at night that it bothers her.  However, she just wanted to know if you feel she should get the Z-pak refilled and take it?  States that she doesn't really feel bad.  She just sounds like she has TB when she's coughing.  And, she doesn't want her new co-workers to feel she's going to make them sick when she starts there on Monday.    She also wants to know if she can get a refill on the Hycodan cough syrup?  She says she still has a little of that left in the bottle.  States she only uses that mostly at night.  What is the best things for her to do?    Pharmacy:  [facility] [address]

## 2016-03-17 NOTE — Telephone Encounter (Signed)
Needs OV and CXR 

## 2016-03-17 NOTE — Telephone Encounter (Signed)
Called patient back; she'll call back next week after she learns her schedule.  She will have to schedule an appointment later.  I explained that we need to be sure she doesn't have pneumonia going on.  You need to listen to her lungs, etc.  States she'll call back next week.

## 2016-05-27 ENCOUNTER — Other Ambulatory Visit: Payer: Self-pay | Admitting: Internal Medicine

## 2016-05-28 NOTE — Telephone Encounter (Signed)
Due for CPE after October 20th. Please book before refilling through october

## 2016-05-29 NOTE — Telephone Encounter (Signed)
LMTCB

## 2016-05-30 NOTE — Telephone Encounter (Signed)
LMTCB

## 2016-05-31 NOTE — Telephone Encounter (Signed)
Appt made and phoned in RX

## 2016-06-15 ENCOUNTER — Ambulatory Visit (INDEPENDENT_AMBULATORY_CARE_PROVIDER_SITE_OTHER): Payer: 59 | Admitting: Internal Medicine

## 2016-06-15 ENCOUNTER — Encounter: Payer: Self-pay | Admitting: Internal Medicine

## 2016-06-15 VITALS — BP 124/60 | HR 86 | Temp 99.6°F | Wt 190.0 lb

## 2016-06-15 DIAGNOSIS — J029 Acute pharyngitis, unspecified: Secondary | ICD-10-CM | POA: Diagnosis not present

## 2016-06-15 DIAGNOSIS — Z87891 Personal history of nicotine dependence: Secondary | ICD-10-CM | POA: Diagnosis not present

## 2016-06-15 DIAGNOSIS — J22 Unspecified acute lower respiratory infection: Secondary | ICD-10-CM

## 2016-06-15 MED ORDER — HYDROCODONE-HOMATROPINE 5-1.5 MG/5ML PO SYRP: 5.0000 mL | ORAL_SOLUTION | Freq: Three times a day (TID) | ORAL | 0 refills | Status: DC | PRN

## 2016-06-15 MED ORDER — AZITHROMYCIN 250 MG PO TABS: ORAL_TABLET | ORAL | 0 refills | Status: DC

## 2016-06-18 NOTE — Patient Instructions (Addendum)
Rest and drink plenty of fluids. Take Hycodan 1 teaspoon by mouth every 8 hours when necessary cough. Zithromax Z-PAK take as directed.

## 2016-06-18 NOTE — Progress Notes (Signed)
   Subjective:    Patient ID: Anita Wagner, female    DOB: 17-Sep-1955, 61 y.o.   MRN: [id number]  HPI Patient has a new job which requires a fair amount of automobile traveling. She is once again in property management business. Has come down with respiratory infection. Has cough and congestion. Has malaise and fatigue. Has sore throat. She is a smoker. Does not want to quit. Have talked with her about this before many times. Has been coughing a lot tickly at night.    Review of Systems see above     Objective:   Physical Exam Skin warm and dry. Nodes none. TMs are slightly full bilaterally. Pharynx is red. Neck is supple. Chest clear to auscultation. No rales or wheezing appreciated.       Assessment & Plan:  Acute pharyngitis  Acute lower respiratory infection  Plan: Hycodan 1 teaspoon by mouth every 8 hours when necessary cough. Zithromax Z-PAK take 2 tablets day one followed by 1 tablet days 2 through 5. Rest and drink plenty of fluids.

## 2016-06-19 ENCOUNTER — Telehealth: Payer: Self-pay | Admitting: Internal Medicine

## 2016-06-19 NOTE — Telephone Encounter (Signed)
Take Dulcolax tablets 2 po initially and repeat in 12 hours if no relief then start Miralax daily if this is a chronic problem

## 2016-06-19 NOTE — Telephone Encounter (Signed)
Pt is aware and has no questions or concerns at this time  

## 2016-06-19 NOTE — Telephone Encounter (Signed)
Patient would like for Dr. Renold Genta to recommend something she can get over the counter for constipation.

## 2016-07-03 ENCOUNTER — Encounter: Payer: Self-pay | Admitting: Internal Medicine

## 2016-07-03 ENCOUNTER — Ambulatory Visit (INDEPENDENT_AMBULATORY_CARE_PROVIDER_SITE_OTHER): Payer: BLUE CROSS/BLUE SHIELD | Admitting: Internal Medicine

## 2016-07-03 VITALS — BP 110/70 | HR 75 | Temp 98.7°F | Ht 67.0 in | Wt 188.0 lb

## 2016-07-03 DIAGNOSIS — Z87891 Personal history of nicotine dependence: Secondary | ICD-10-CM | POA: Diagnosis not present

## 2016-07-03 DIAGNOSIS — J029 Acute pharyngitis, unspecified: Secondary | ICD-10-CM | POA: Diagnosis not present

## 2016-07-03 DIAGNOSIS — H6693 Otitis media, unspecified, bilateral: Secondary | ICD-10-CM | POA: Diagnosis not present

## 2016-07-03 DIAGNOSIS — I889 Nonspecific lymphadenitis, unspecified: Secondary | ICD-10-CM

## 2016-07-03 MED ORDER — HYDROCODONE-HOMATROPINE 5-1.5 MG/5ML PO SYRP: 5.0000 mL | ORAL_SOLUTION | Freq: Three times a day (TID) | ORAL | 0 refills | Status: DC | PRN

## 2016-07-03 MED ORDER — DOXYCYCLINE HYCLATE 100 MG PO TABS: 100.0000 mg | ORAL_TABLET | Freq: Two times a day (BID) | ORAL | 0 refills | Status: DC

## 2016-07-03 MED ORDER — METHYLPREDNISOLONE ACETATE 80 MG/ML IJ SUSP
80.0000 mg | Freq: Once | INTRAMUSCULAR | Status: AC
  Administered 2016-07-03: 80 mg via INTRAMUSCULAR

## 2016-07-03 NOTE — Patient Instructions (Signed)
Doxycycline 100 mg twice daily for 10 days. Refill Hycodan to take sparingly every 8 hours when necessary cough. This may also help with sore throat pain.  Depo-Medrol 80 mg IM. Rest and drink plenty of fluids.

## 2016-07-03 NOTE — Progress Notes (Signed)
   Subjective:    Patient ID: Anita Wagner, female    DOB: 25-Oct-1955, 61 y.o.   MRN: [id number]  HPI 61 year old  Female smoker who was here June 7 with acute respiratory infection. She was complaining of sore throat, cough and congestion. She has some ear discomfort. She was treated with Zithromax Z-Pak and Hycodan 1 teaspoon by mouth every 8 hours when necessary cough. She had been coughing a lot particularly at night.  She is a smoker. She did not want to have chest x-ray.  She had a small lymph node in her right lateral neck which was tender and resolved after taking the Z-Pak.  Recently symptoms have returned. She does not want chest x-ray today. Has right ear pain and sore throat. Says lymph node in right neck has returned. Denies fever.    Review of Systems see above . She says Hycodan made her constipated but she took Dulcolax and got relief.     Objective:   Physical Exam Skin warm and dry. She has a dime size lymph node right neck which is tender to touch. It is slightly firm. Pharynx is slightly injected without exudate. TMs are dull bilaterally without light reflex. There is a small amount of wax in the right external ear canal that is not obstructing the ear canal. Neck is supple. Chest is clear to auscultation without rales or wheezing.       Assessment & Plan:  Bilateral otitis media  Pharyngitis  Cough  History of smoking  Plan: Depo-Medrol 80 mg IM. Hycodan 1 teaspoon by mouth every 8 hours when necessary cough. Patient requested refill. Doxycycline 100 mg twice daily for 10 days. Rest and drink plenty of fluids.

## 2016-10-04 DIAGNOSIS — Z029 Encounter for administrative examinations, unspecified: Secondary | ICD-10-CM

## 2016-10-27 ENCOUNTER — Other Ambulatory Visit: Payer: 59 | Admitting: Internal Medicine

## 2016-10-30 ENCOUNTER — Encounter: Payer: 59 | Admitting: Internal Medicine

## 2016-12-04 ENCOUNTER — Other Ambulatory Visit: Payer: Self-pay | Admitting: Internal Medicine

## 2016-12-04 NOTE — Telephone Encounter (Signed)
Refill x 90 days Has CPE in January

## 2017-01-01 ENCOUNTER — Other Ambulatory Visit: Payer: Self-pay | Admitting: Internal Medicine

## 2017-01-01 DIAGNOSIS — Z1329 Encounter for screening for other suspected endocrine disorder: Secondary | ICD-10-CM

## 2017-01-01 DIAGNOSIS — Z0000 Encounter for general adult medical examination without abnormal findings: Secondary | ICD-10-CM

## 2017-01-01 DIAGNOSIS — Z13220 Encounter for screening for lipoid disorders: Secondary | ICD-10-CM

## 2017-01-01 NOTE — Progress Notes (Unsigned)
ERROR

## 2017-01-07 ENCOUNTER — Telehealth: Payer: Self-pay | Admitting: Internal Medicine

## 2017-01-07 MED ORDER — DEXLANSOPRAZOLE 60 MG PO CPDR: 60.0000 mg | DELAYED_RELEASE_CAPSULE | Freq: Every day | ORAL | 1 refills | Status: DC

## 2017-01-07 NOTE — Telephone Encounter (Signed)
Pt has appt in January. Pharmacy requesting 90 day supply of Dexilant #90 with one refill

## 2017-01-08 ENCOUNTER — Other Ambulatory Visit: Payer: BLUE CROSS/BLUE SHIELD | Admitting: Internal Medicine

## 2017-01-08 DIAGNOSIS — Z1329 Encounter for screening for other suspected endocrine disorder: Secondary | ICD-10-CM

## 2017-01-08 DIAGNOSIS — Z0000 Encounter for general adult medical examination without abnormal findings: Secondary | ICD-10-CM

## 2017-01-08 DIAGNOSIS — Z13220 Encounter for screening for lipoid disorders: Secondary | ICD-10-CM

## 2017-01-09 LAB — COMPLETE METABOLIC PANEL WITH GFR
AG Ratio: 1.3 (calc) (ref 1.0–2.5)
ALBUMIN MSPROF: 4.1 g/dL (ref 3.6–5.1)
ALKALINE PHOSPHATASE (APISO): 58 U/L (ref 33–130)
ALT: 14 U/L (ref 6–29)
AST: 15 U/L (ref 10–35)
BILIRUBIN TOTAL: 0.5 mg/dL (ref 0.2–1.2)
BUN: 14 mg/dL (ref 7–25)
CHLORIDE: 105 mmol/L (ref 98–110)
CO2: 28 mmol/L (ref 20–32)
CREATININE: 0.69 mg/dL (ref 0.50–0.99)
Calcium: 9.4 mg/dL (ref 8.6–10.4)
GFR, Est African American: 108 mL/min/{1.73_m2} (ref >=60)
GFR, Est Non African American: 93 mL/min/{1.73_m2} (ref >=60)
GLUCOSE: 102 mg/dL — AB (ref 65–99)
Globulin: 3.1 g/dL (calc) (ref 1.9–3.7)
Potassium: 4.8 mmol/L (ref 3.5–5.3)
Sodium: 140 mmol/L (ref 135–146)
Total Protein: 7.2 g/dL (ref 6.1–8.1)

## 2017-01-09 LAB — CBC WITH DIFFERENTIAL/PLATELET
BASOS PCT: 0.9 %
Basophils Absolute: 62 cells/uL (ref 0–200)
EOS PCT: 2.6 %
Eosinophils Absolute: 179 cells/uL (ref 15–500)
HEMATOCRIT: 40.1 % (ref 35.0–45.0)
Hemoglobin: 14.2 g/dL (ref 11.7–15.5)
LYMPHS ABS: 1421 {cells}/uL (ref 850–3900)
MCH: 32.3 pg (ref 27.0–33.0)
MCHC: 35.4 g/dL (ref 32.0–36.0)
MCV: 91.3 fL (ref 80.0–100.0)
MPV: 11 fL (ref 7.5–12.5)
Monocytes Relative: 7.4 %
NEUTROS ABS: 4727 {cells}/uL (ref 1500–7800)
Neutrophils Relative %: 68.5 %
Platelets: 244 10*3/uL (ref 140–400)
RBC: 4.39 10*6/uL (ref 3.80–5.10)
RDW: 11.7 % (ref 11.0–15.0)
Total Lymphocyte: 20.6 %
WBC: 6.9 10*3/uL (ref 3.8–10.8)
WBCMIX: 511 {cells}/uL (ref 200–950)

## 2017-01-09 LAB — LIPID PANEL
CHOL/HDL RATIO: 4 (calc) (ref <5.0)
Cholesterol: 220 mg/dL — ABNORMAL HIGH (ref <200)
HDL: 55 mg/dL (ref >50)
LDL CHOLESTEROL (CALC): 142 mg/dL — AB
NON-HDL CHOLESTEROL (CALC): 165 mg/dL — AB (ref <130)
TRIGLYCERIDES: 116 mg/dL (ref <150)

## 2017-01-09 LAB — VITAMIN D 25 HYDROXY (VIT D DEFICIENCY, FRACTURES): Vit D, 25-Hydroxy: 34 ng/mL (ref 30–100)

## 2017-01-09 LAB — TSH: TSH: 1.85 mIU/L (ref 0.40–4.50)

## 2017-01-12 ENCOUNTER — Other Ambulatory Visit: Payer: BLUE CROSS/BLUE SHIELD | Admitting: Internal Medicine

## 2017-01-13 ENCOUNTER — Other Ambulatory Visit: Payer: Self-pay | Admitting: Internal Medicine

## 2017-01-15 ENCOUNTER — Encounter: Payer: BLUE CROSS/BLUE SHIELD | Admitting: Internal Medicine

## 2017-01-16 ENCOUNTER — Ambulatory Visit (INDEPENDENT_AMBULATORY_CARE_PROVIDER_SITE_OTHER): Payer: BLUE CROSS/BLUE SHIELD | Admitting: Internal Medicine

## 2017-01-16 ENCOUNTER — Encounter: Payer: Self-pay | Admitting: Internal Medicine

## 2017-01-16 VITALS — BP 120/70 | Ht 66.0 in | Wt 198.0 lb

## 2017-01-16 DIAGNOSIS — E7800 Pure hypercholesterolemia, unspecified: Secondary | ICD-10-CM | POA: Diagnosis not present

## 2017-01-16 DIAGNOSIS — Z6831 Body mass index (BMI) 31.0-31.9, adult: Secondary | ICD-10-CM

## 2017-01-16 DIAGNOSIS — K219 Gastro-esophageal reflux disease without esophagitis: Secondary | ICD-10-CM

## 2017-01-16 DIAGNOSIS — F411 Generalized anxiety disorder: Secondary | ICD-10-CM | POA: Diagnosis not present

## 2017-01-16 DIAGNOSIS — Z87891 Personal history of nicotine dependence: Secondary | ICD-10-CM | POA: Diagnosis not present

## 2017-01-16 DIAGNOSIS — Z86718 Personal history of other venous thrombosis and embolism: Secondary | ICD-10-CM | POA: Diagnosis not present

## 2017-01-16 DIAGNOSIS — Z0000 Encounter for general adult medical examination without abnormal findings: Secondary | ICD-10-CM

## 2017-01-16 LAB — POCT URINALYSIS DIPSTICK
Appearance: NORMAL
Bilirubin, UA: NEGATIVE
Blood, UA: NEGATIVE
Glucose, UA: NEGATIVE
Ketones, UA: NEGATIVE
LEUKOCYTES UA: NEGATIVE
NITRITE UA: NEGATIVE
ODOR: NORMAL
PH UA: 6.5 (ref 5.0–8.0)
PROTEIN UA: NEGATIVE
Spec Grav, UA: 1.01 (ref 1.010–1.025)
UROBILINOGEN UA: 0.2 U/dL

## 2017-01-16 NOTE — Progress Notes (Signed)
Subjective:    Patient ID: Anita Wagner, female    DOB: 09-03-1955, 62 y.o.   MRN: [id number]  HPI 62 year old Female for health maintenance exam and evaluation of medical issues.  She has a long-standing  history of anxiety treated with Klonopin.  History of acute diverticulitis September 2016.  Continues to have issues with her weight.  Traveling a lot with her work and not able to diet and exercise she says.  History of right lower extremity DVT December 2015 related to taking a long trip, estrogen replacement and smoking.  Unfortunately continues to smoke.  Multiple drug intolerances including Augmentin, amoxicillin, sulfa, Levaquin.  Intolerant of erythromycin but can take Zithromax.  History of GE reflux and allergic rhinitis which is long-standing  Tonsillectomy at age 67, laparoscopic surgery for endometriosis 1997.  Total abdominal hysterectomy BSO for endometriosis July 2004.  Tetanus immunization given 2013.    Benign left needle breast biopsy 2004.  Social history: She has a high school education.  Husband is  a retired Barrister's clerk.  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  His mood for well over 20 years and generally smokes about a pack a day.  One glass of wine daily.  No children.  First marriage ended in divorce.  Family history: Mother with history of dementia, congestive heart failure arthritis resides in collapse nursing center.  One brother with bipolar disorder otherwise good health.  Father died in 51 with lung cancer.    Review of Systems  Constitutional: Negative.   Respiratory: Negative.   Cardiovascular: Negative.   Gastrointestinal: Negative.        Objective:   Physical Exam  Constitutional: She is oriented to person, place, and time. She appears well-developed and well-nourished. No distress.  HENT:  Head: Normocephalic and atraumatic.  Right  Ear: External ear normal.  Left Ear: External ear normal.  Mouth/Throat: Oropharynx is clear and moist.  Eyes: EOM are normal. Pupils are equal, round, and reactive to light. Right eye exhibits no discharge. Left eye exhibits no discharge. No scleral icterus.  Neck: Neck supple. No JVD present. No thyromegaly present.  Cardiovascular: Normal rate, regular rhythm, normal heart sounds and intact distal pulses.  No murmur heard. Pulmonary/Chest: Effort normal and breath sounds normal. No respiratory distress. She has no wheezes. She has no rales.  Abdominal: Soft. Bowel sounds are normal. She exhibits no distension and no mass. There is no tenderness. There is no rebound and no guarding.  Genitourinary:  Genitourinary Comments: Deferred status post TAH/BSO for endometriosis  Musculoskeletal: She exhibits no edema.  Lymphadenopathy:    She has no cervical adenopathy.  Neurological: She is alert and oriented to person, place, and time. She has normal reflexes. She displays normal reflexes. No cranial nerve deficit. Coordination normal.  Skin: Skin is warm and dry. No rash noted. She is not diaphoretic.  Psychiatric: She has a normal mood and affect. Her behavior is normal. Judgment and thought content normal.  Vitals reviewed.         Assessment & Plan:  Normal health status exam  BMI-31.96  History of smoking  Anxiety  History of right leg DVT in 2014 without recurrence and currently not on anticoagulation therapy  History of diverticulitis 2016  GE reflux requiring PPI therapy  Pure hypercholesterolemia-does not want to be on statin medication..  Recheck lipid panel in 6 months  Plan: Continue same medications and return in 6 months.  Counseled regarding diet exercise and weight loss and smoking cessation.

## 2017-01-25 ENCOUNTER — Telehealth: Payer: Self-pay

## 2017-01-25 MED ORDER — PANTOPRAZOLE SODIUM 40 MG PO TBEC: 40.0000 mg | DELAYED_RELEASE_TABLET | Freq: Every day | ORAL | 3 refills | Status: DC

## 2017-01-25 NOTE — Telephone Encounter (Signed)
ESCRIBED

## 2017-01-25 NOTE — Telephone Encounter (Signed)
Start with Pantoprazole(Protonix) 40 mg daily and discontinue Dexilant

## 2017-01-25 NOTE — Telephone Encounter (Signed)
Patient states her insurance does not cover [place] 60MG  but they will cover  Lansoprazole, Esomeprazole and pantoprazole she would like to know which one you think would be best for her?

## 2017-01-29 ENCOUNTER — Telehealth: Payer: Self-pay | Admitting: Internal Medicine

## 2017-01-29 NOTE — Telephone Encounter (Signed)
States that the Protonix is not working.  States that her husband has to take 2 Protonix per day for it to work for him.  Says that she's suffering.  She said she started the Protonix on Friday and she has had burning in the throat since Friday.  She just wonders if she increases the Protonix if it will work because she was taking 60mg  of Dexilant and this is only 40mg  currently that she's taking.    The other medication on the list that her insurance will cover is:  Lansoprazole and Esomeprazole.    [facility] [address] [facility] # for contact:  [phone]  Thank you.

## 2017-01-29 NOTE — Telephone Encounter (Signed)
Called patient and advised of Dr. Verlene Mayer instruction.    Patient has already tried a coupon that we had here in the office for [place].  And, she also tried the GoodRx coupon and they wouldn't accept that either.    She'll try the 2 Protonix bid and we'll go from there.  If that doesn't work, she'll call us back.  And, if need be, we'll do GI consult for her.    Araceli, will you please send new Rx to her pharmacy for 2 Protonix bid per Dr. Verlene Mayer instruction below?  Thanks so much.

## 2017-01-29 NOTE — Telephone Encounter (Signed)
Really can't compare the 2 drugs that way but could take 2 Protonix twice a day. May need to see GI if no relief. Usually have to fail 2 drugs to get Dexilant approved. May be able to find a coupon on line or get a good price with Good Rx card. Will require some research on her part. Not sure if we have done H.pylori breath test.

## 2017-01-30 MED ORDER — PANTOPRAZOLE SODIUM 40 MG PO TBEC: 40.0000 mg | DELAYED_RELEASE_TABLET | Freq: Two times a day (BID) | ORAL | 3 refills | Status: DC

## 2017-01-30 NOTE — Telephone Encounter (Signed)
New rx was sent to patient's pharmacy.

## 2017-02-07 NOTE — Patient Instructions (Addendum)
Counseled regarding diet exercise and weight loss.  Watch fast foods and return in 6 months to have cholesterol rechecked.  Continue same medications.  Please quit smoking.

## 2017-02-15 ENCOUNTER — Telehealth: Payer: Self-pay | Admitting: Internal Medicine

## 2017-02-15 ENCOUNTER — Other Ambulatory Visit: Payer: Self-pay

## 2017-02-15 MED ORDER — CLONAZEPAM 0.5 MG PO TABS: 0.5000 mg | ORAL_TABLET | Freq: Two times a day (BID) | ORAL | 0 refills | Status: DC

## 2017-02-15 NOTE — Telephone Encounter (Signed)
Have called pharmacy  And left voice mail that Rx is to be filled for 90 days  bid.

## 2017-02-15 NOTE — Telephone Encounter (Signed)
Patient calls to request refill on her Klonopin 0.5 mg.  States that the pharmacy told her it was too soon.  It was last filled 11/27.  She said they have her as taking 1 per day and we show her as taking it bid on her last Rx.  She says she has always taken it as 1 in the morning and 1 at night.  So, the pharmacy must've filled incorrectly last time??  She said she took her last one this morning and just noticed on the bottle when she called the pharmacy that it did say take 1 per day.    [facility] # [phone]  Thanks.

## 2017-03-12 ENCOUNTER — Ambulatory Visit (INDEPENDENT_AMBULATORY_CARE_PROVIDER_SITE_OTHER): Payer: BLUE CROSS/BLUE SHIELD | Admitting: Internal Medicine

## 2017-03-12 ENCOUNTER — Encounter: Payer: Self-pay | Admitting: Internal Medicine

## 2017-03-12 VITALS — BP 120/70 | HR 80 | Temp 98.1°F | Ht 66.0 in | Wt 200.0 lb

## 2017-03-12 DIAGNOSIS — H6503 Acute serous otitis media, bilateral: Secondary | ICD-10-CM | POA: Diagnosis not present

## 2017-03-12 MED ORDER — HYDROCODONE-HOMATROPINE 5-1.5 MG/5ML PO SYRP: 5.0000 mL | ORAL_SOLUTION | Freq: Three times a day (TID) | ORAL | 0 refills | Status: DC | PRN

## 2017-03-12 MED ORDER — AZITHROMYCIN 250 MG PO TABS: ORAL_TABLET | ORAL | 0 refills | Status: DC

## 2017-03-12 NOTE — Patient Instructions (Signed)
Hycodan one tsp po q 8 hours prn cough and sore throat pain. Zithromax Z pak as directed.

## 2017-03-20 IMAGING — CT CT ABD-PELV W/ CM
2 of 5 series · 16 of 46 positions shown, 18 images · IV contrast (Omni 300)
Comparison: None.

CLINICAL DATA: LLQ pain for a week and a half. No nausea, vomiting
or diarrhea. 100mL omni 300^100mL OMNIPAQUE IOHEXOL 300 MG/ML SOLN

EXAM:
CT ABDOMEN AND PELVIS WITH CONTRAST
TECHNIQUE: Multidetector CT imaging of the abdomen and pelvis was performed
using the standard protocol following bolus administration of
intravenous contrast.
CONTRAST:  100mL OMNIPAQUE IOHEXOL 300 MG/ML  SOLN

[Series 2: abd/pelv 2.0 cor · coronal · 0.87mm/px · 3 of 145 slices shown]
[im 49/145  soft-tissue]
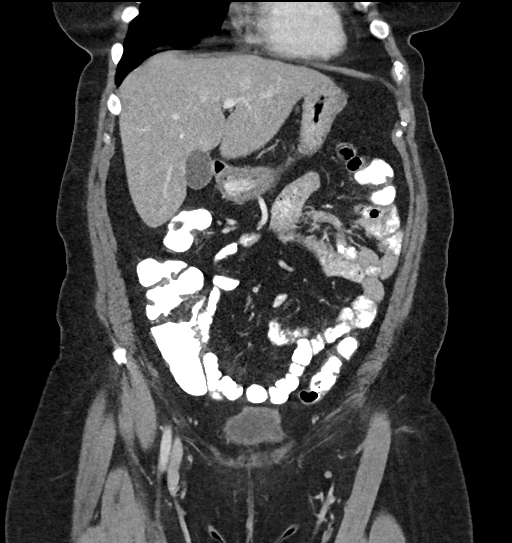
[im 65/145  soft-tissue]
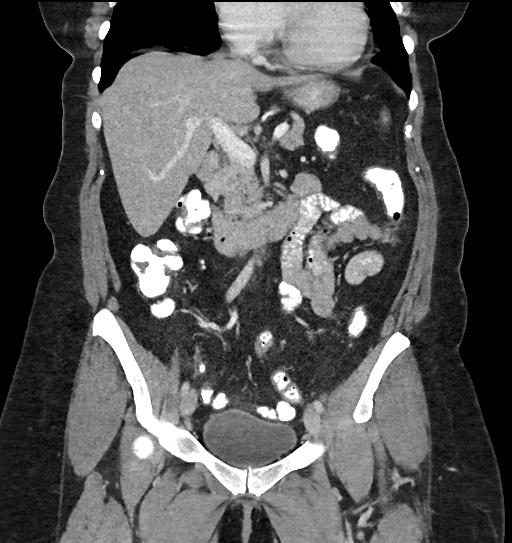
[im 81/145  soft-tissue]
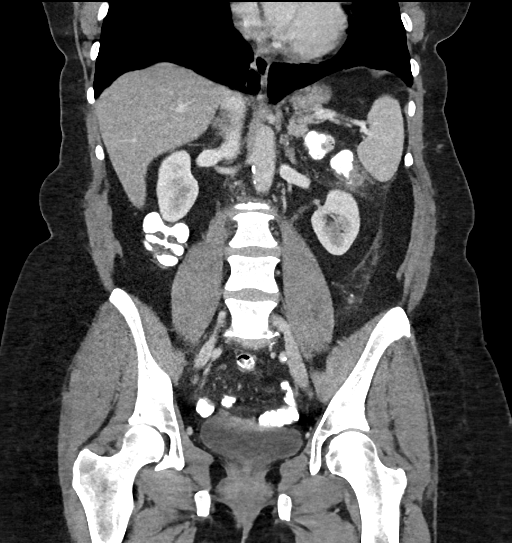

[Series 7: abd/pelv 5.0 i30f 3 · axial · 0.85mm/px · z∈[+984,+1404]mm · 13 of 94 slices shown, 15 images]
[im 5/94  soft-tissue]
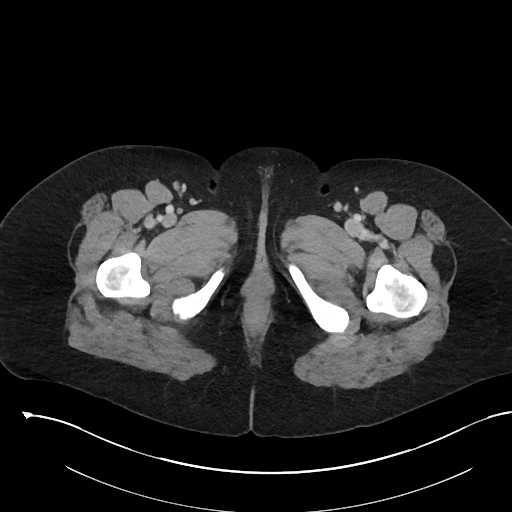
[im 5/94  bone]
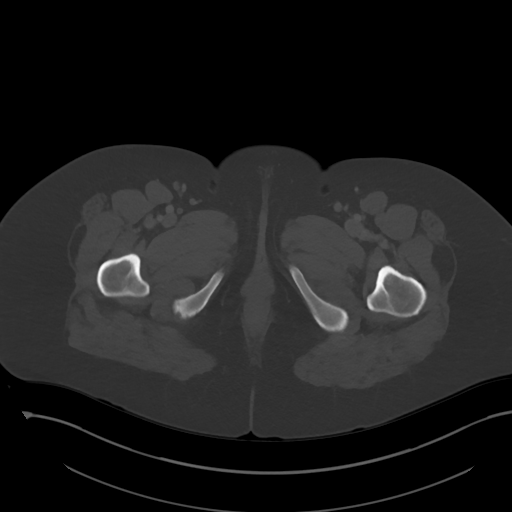
[im 15/94  soft-tissue]
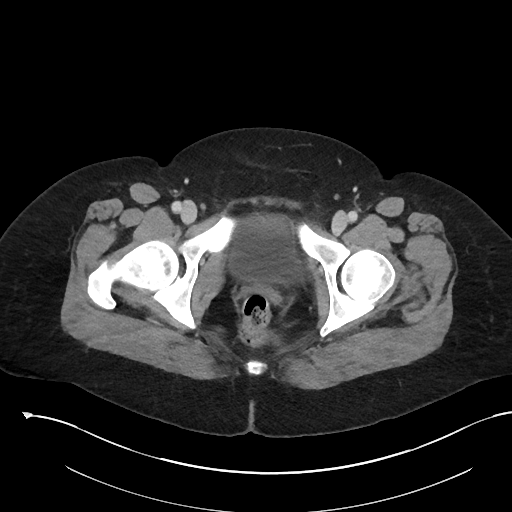
[im 20/94  soft-tissue]
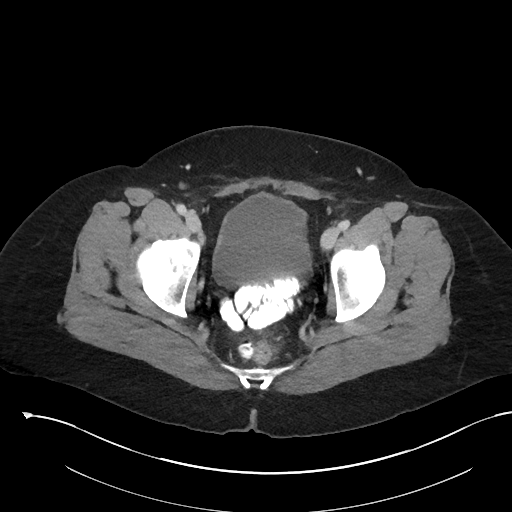
[im 25/94  soft-tissue]
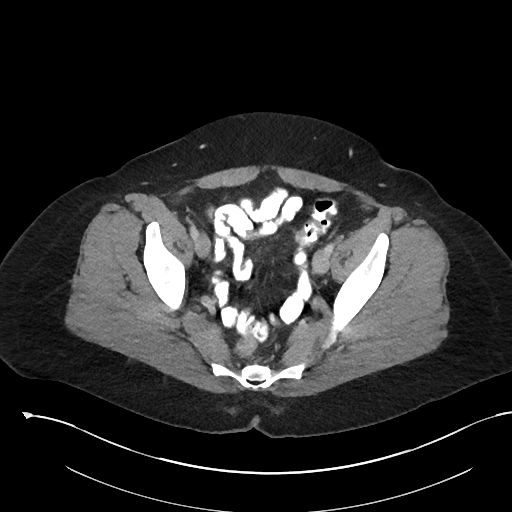
[im 35/94  soft-tissue]
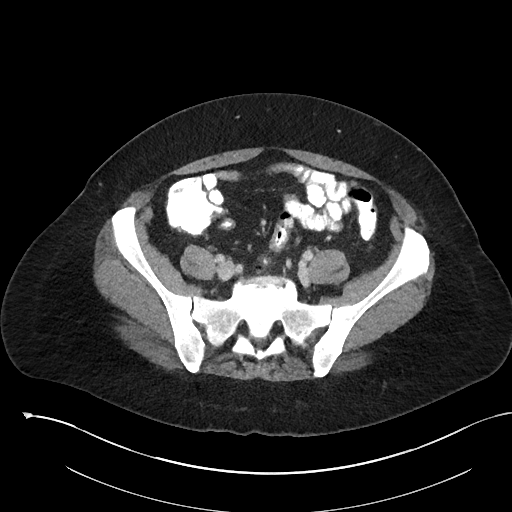
[im 40/94  soft-tissue]
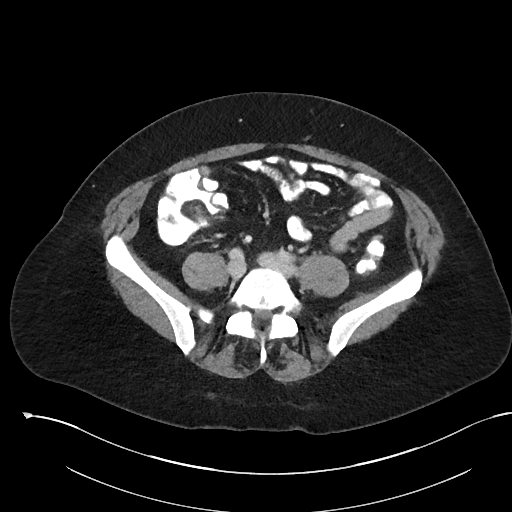
[im 49/94  soft-tissue]
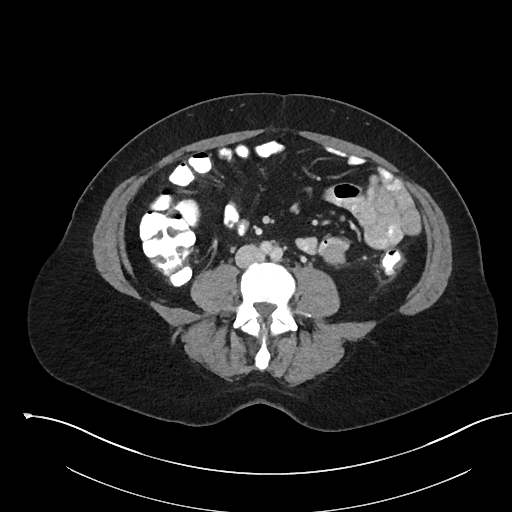
[im 54/94  soft-tissue]
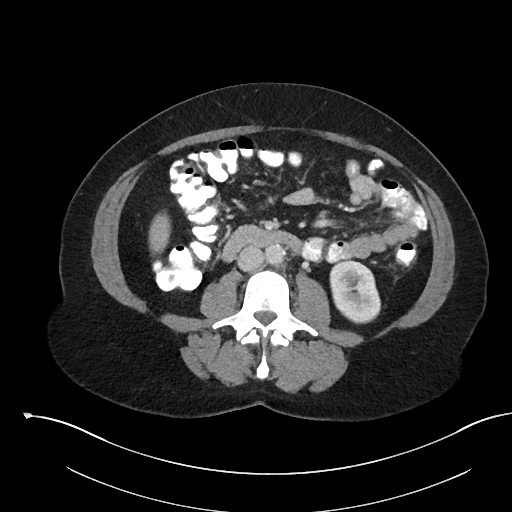
[im 59/94  soft-tissue]
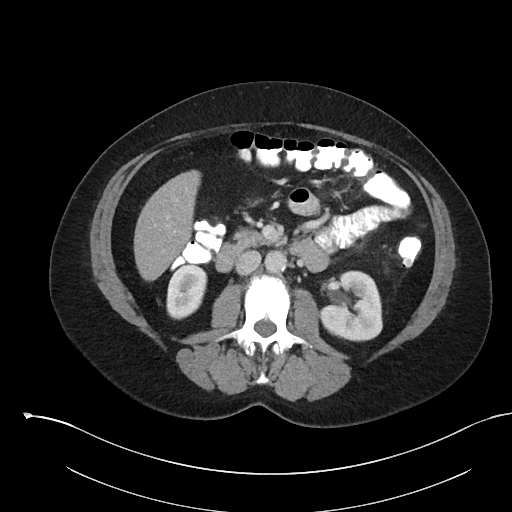
[im 59/94  bone]
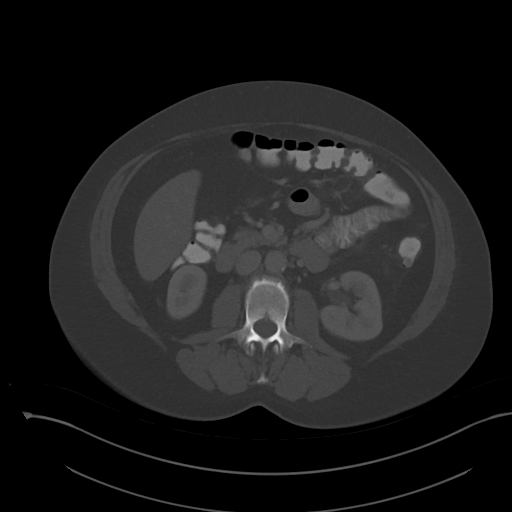
[im 69/94  soft-tissue]
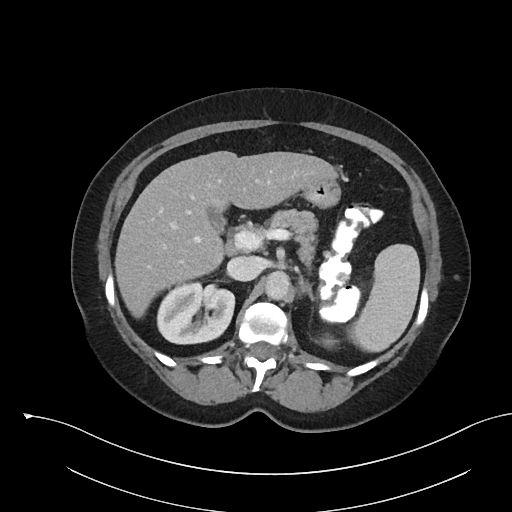
[im 74/94  soft-tissue]
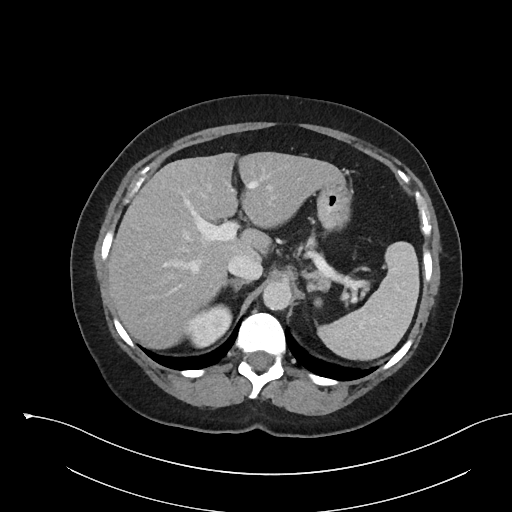
[im 79/94  soft-tissue]
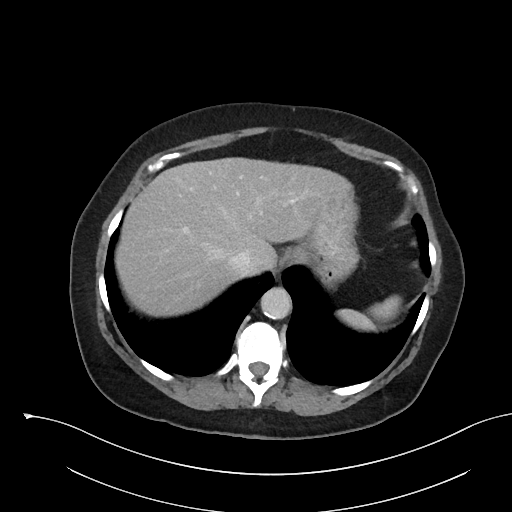
[im 89/94  soft-tissue]
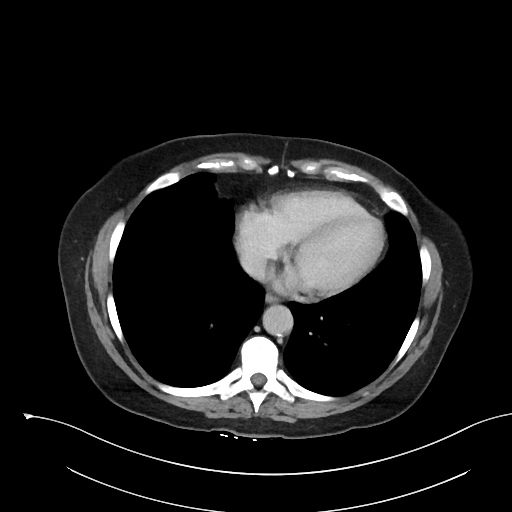

[16 of 46 positions shown; findings below may reference images not displayed]

FINDINGS: Lower chest: Motion degraded images of the lower chest. Heart size
is normal.

Upper abdomen: No focal abnormality identified within the liver,
spleen, pancreas, or adrenal glands. Kidneys are symmetric in size.
No hydronephrosis or renal mass identified. Gallbladder is present.

Gastrointestinal tract: Stomach and small bowel loops are normal in
appearance. The appendix is well seen and has a normal appearance.
There are numerous colonic diverticula. There is focal thickening of
the wall of the splenic flexure, measuring up to 1.4 cm in
thickness. The thickening of the wall of this segment appears
somewhat eccentric and raises question of mass rather than acute
diverticulitis. However there are diverticula within this segment of
the colon and diverticulitis is a consideration as well. There is
thickening of the descending colonic wall associated with
diverticula and pericolonic stranding (typical changes of acute
diverticulitis) . No evidence for perforation or abscess.

Pelvis: Urinary bladder has a normal appearance. Uterus is
surgically absent. No adnexal mass or free pelvic fluid identified.

Retroperitoneum: There is atherosclerotic calcification of the
abdominal aorta. No aneurysm or dissection identified. No
retroperitoneal or mesenteric adenopathy.

Abdominal wall: Unremarkable.

Osseous structures: Degenerative disc disease most notable at L5-S1.
No suspicious lytic or blastic lesions are identified.
IMPRESSION: 1. Moderate colonic diverticulosis. There is inflammatory change and
colonic wall thickening of the descending colon consistent with
acute diverticulitis.
2. Focal thickening of the splenic flexure colonic wall, suspicious
for neoplasm. Focal area of inflammation due to diverticulitis could
have a similar appearance. Consider colonoscopy for further
evaluation.
3. Abdominal aortic atherosclerosis.
4. Degenerative disc disease.

The salient findings were discussed with GIORGI JUMPER on 10/01/2014 at
[DATE].

## 2017-04-07 NOTE — Progress Notes (Signed)
   Subjective:    Patient ID: Anita Wagner, female    DOB: 09/25/1955, 62 y.o.   MRN: [id number]  HPI In today complaining of right ear pain.  Thinks she may have had upper respiratory infection.  No fever, shaking chills, or flulike  symptoms.    Review of Systems see above     Objective:   Physical Exam Skin warm and dry.  Nodes none.  TMs are full bilaterally and slightly pink.  Pharynx slightly injected but no exudate.  Neck is supple without  adenopathy.  Chest clear to auscultation.       Assessment & Plan:  Acute bilateral serous otitis media  Plan: Zithromax Z-Pak take 2 tablets day 1 followed by 1 tablet days 2 through 5.  Hycodan 1 teaspoon p.o. every 8 hours as needed cough.  Rest and drink plenty of fluids.

## 2017-04-18 ENCOUNTER — Other Ambulatory Visit: Payer: Self-pay | Admitting: Internal Medicine

## 2017-04-19 ENCOUNTER — Other Ambulatory Visit: Payer: Self-pay | Admitting: Internal Medicine

## 2017-04-20 NOTE — Telephone Encounter (Signed)
Refill x 6 months 

## 2017-05-01 ENCOUNTER — Telehealth: Payer: Self-pay | Admitting: Internal Medicine

## 2017-05-01 NOTE — Telephone Encounter (Signed)
Staysha Truby Self [phone]  [facility] called to say she needs an antibiotic, she has fluid in her ear and draining in her throat. She is traveling with job, she is in [place] today and will not be back during the day until Friday, but she is coming home each evening. I let her know she will probably need to be seen to get an antibiotic.

## 2017-05-01 NOTE — Telephone Encounter (Signed)
Pt can be seen here on Friday when she is back in town or go to urgent care where she is.

## 2017-05-04 ENCOUNTER — Ambulatory Visit: Payer: BLUE CROSS/BLUE SHIELD | Admitting: Internal Medicine

## 2017-05-17 ENCOUNTER — Ambulatory Visit: Payer: BLUE CROSS/BLUE SHIELD | Admitting: Internal Medicine

## 2017-05-17 VITALS — BP 110/80 | HR 82 | Temp 98.5°F | Ht 66.0 in | Wt 200.0 lb

## 2017-05-17 DIAGNOSIS — H6501 Acute serous otitis media, right ear: Secondary | ICD-10-CM | POA: Diagnosis not present

## 2017-05-17 MED ORDER — AZITHROMYCIN 250 MG PO TABS
ORAL_TABLET | ORAL | 0 refills | Status: DC
Start: 2017-05-17 — End: 2017-09-20

## 2017-05-17 NOTE — Progress Notes (Signed)
   Subjective:    Patient ID: Anita Wagner, female    DOB: September 15, 1955, 62 y.o.   MRN: [id number]  HPI Patient was traveling recently for work and could not come in to be seen.  Has developed otalgia and postnasal drip.  No fever or chills.  No significant sore throat.  No significant cough.  See above    Review of Systems see above     Objective:   Physical Exam  Right TM is full but not red.  Left TM clear.  Pharynx is clear.  Neck supple.  Chest clear to auscultation.      Assessment & Plan:  Acute right serous otitis media  Plan: Zithromax Z-Pak take 2 tablets day 1 followed by 1 tablet days 2 through 5.  Call if not better in 7 to 10 days or sooner if worse.

## 2017-06-04 ENCOUNTER — Encounter: Payer: Self-pay | Admitting: Internal Medicine

## 2017-06-04 NOTE — Patient Instructions (Signed)
Take Zithromax Z-PAK 2 p.o. day 1 followed by 1 p.o. days 2 through 5. 

## 2017-06-28 ENCOUNTER — Other Ambulatory Visit: Payer: Self-pay

## 2017-06-28 DIAGNOSIS — E7800 Pure hypercholesterolemia, unspecified: Secondary | ICD-10-CM

## 2017-07-17 ENCOUNTER — Other Ambulatory Visit: Payer: BLUE CROSS/BLUE SHIELD | Admitting: Internal Medicine

## 2017-07-20 ENCOUNTER — Ambulatory Visit: Payer: BLUE CROSS/BLUE SHIELD | Admitting: Internal Medicine

## 2017-08-31 ENCOUNTER — Other Ambulatory Visit: Payer: Self-pay | Admitting: Internal Medicine

## 2017-09-17 ENCOUNTER — Other Ambulatory Visit: Payer: Self-pay | Admitting: Internal Medicine

## 2017-09-17 DIAGNOSIS — E7800 Pure hypercholesterolemia, unspecified: Secondary | ICD-10-CM

## 2017-09-18 ENCOUNTER — Other Ambulatory Visit: Payer: 59 | Admitting: Internal Medicine

## 2017-09-18 DIAGNOSIS — E7800 Pure hypercholesterolemia, unspecified: Secondary | ICD-10-CM | POA: Diagnosis not present

## 2017-09-18 LAB — LIPID PANEL
Cholesterol: 205 mg/dL — ABNORMAL HIGH (ref <200)
HDL: 51 mg/dL (ref >50)
LDL Cholesterol (Calc): 129 mg/dL (calc) — ABNORMAL HIGH
Non-HDL Cholesterol (Calc): 154 mg/dL (calc) — ABNORMAL HIGH (ref <130)
Total CHOL/HDL Ratio: 4 (calc) (ref <5.0)
Triglycerides: 139 mg/dL (ref <150)

## 2017-09-20 ENCOUNTER — Ambulatory Visit: Payer: 59 | Admitting: Internal Medicine

## 2017-09-20 ENCOUNTER — Encounter: Payer: Self-pay | Admitting: Internal Medicine

## 2017-09-20 VITALS — BP 126/82 | HR 66 | Temp 98.3°F | Ht 66.0 in | Wt 195.0 lb

## 2017-09-20 DIAGNOSIS — R7302 Impaired glucose tolerance (oral): Secondary | ICD-10-CM | POA: Diagnosis not present

## 2017-09-20 DIAGNOSIS — Z23 Encounter for immunization: Secondary | ICD-10-CM | POA: Diagnosis not present

## 2017-09-20 DIAGNOSIS — E7800 Pure hypercholesterolemia, unspecified: Secondary | ICD-10-CM | POA: Diagnosis not present

## 2017-09-20 DIAGNOSIS — Z6831 Body mass index (BMI) 31.0-31.9, adult: Secondary | ICD-10-CM

## 2017-09-20 DIAGNOSIS — Z131 Encounter for screening for diabetes mellitus: Secondary | ICD-10-CM

## 2017-09-20 DIAGNOSIS — Z8659 Personal history of other mental and behavioral disorders: Secondary | ICD-10-CM

## 2017-09-20 DIAGNOSIS — Z87891 Personal history of nicotine dependence: Secondary | ICD-10-CM

## 2017-09-20 MED ORDER — CLONAZEPAM 0.5 MG PO TABS: 0.5000 mg | ORAL_TABLET | Freq: Two times a day (BID) | ORAL | 5 refills | Status: DC | PRN

## 2017-09-20 NOTE — Progress Notes (Deleted)
   Subjective:    Patient ID: Anita Wagner, female    DOB: 03/23/1955, 62 y.o.   MRN: [id number]  HPI    Review of Systems     Objective:   Physical Exam        Assessment & Plan:   

## 2017-09-20 NOTE — Patient Instructions (Addendum)
Continue diet and exercise efforts.  Consider obesity clinic.  You have developed impaired glucose tolerance.  Follow up in 6 months for physical exam.  Klonopin can be refilled.

## 2017-09-20 NOTE — Progress Notes (Deleted)
   Subjective:    Patient ID: Anita Wagner, female    DOB: 09/03/1955, 62 y.o.   MRN: [id number]  HPI    Review of Systems     Objective:   Physical Exam        Assessment & Plan:   

## 2017-09-21 LAB — HEMOGLOBIN A1C
HEMOGLOBIN A1C: 5.7 %{Hb} — AB (ref <5.7)
Mean Plasma Glucose: 117 (calc)
eAG (mmol/L): 6.5 (calc)

## 2017-09-25 DIAGNOSIS — H04123 Dry eye syndrome of bilateral lacrimal glands: Secondary | ICD-10-CM | POA: Diagnosis not present

## 2017-10-02 ENCOUNTER — Other Ambulatory Visit: Payer: Self-pay | Admitting: Internal Medicine

## 2017-10-07 NOTE — Progress Notes (Signed)
   Subjective:    Patient ID: Anita Wagner, female    DOB: 1955-08-05, 62 y.o.   MRN: [id number]  HPI 62 year old female with history of anxiety, GE reflux, remote history of DVT, history of smoking in today for 50-month recheck.  In January had total cholesterol of 220 with an LDL cholesterol of 142.  Cholesterol is now 205 with an LDL of 129.  She has impaired glucose tolerance with a hemoglobin of 5.7%.  She is also overweight.  She does not want to be on statin medication.  Now that she has a new job and does not have to drive long distances, she needs to find some time to work on diet and exercise.    Review of Systems  Respiratory: Negative.   Cardiovascular: Negative.   Psychiatric/Behavioral:       Mother in long-term care facility.  Patient still has situational stress and anxiety       Objective:   Physical Exam  Constitutional: She appears well-developed and well-nourished.  Neck: No JVD present. No thyromegaly present.  Cardiovascular: Normal rate, regular rhythm and normal heart sounds.  No murmur heard. Pulmonary/Chest: Effort normal and breath sounds normal. No respiratory distress. She has no wheezes.  Musculoskeletal: She exhibits no edema.  Skin: Skin is warm and dry.  Vitals reviewed.         Assessment & Plan:  Long-standing history of anxiety stable with Klonopin.  Okay to continue with Klonopin  GE reflux-treated with PPI  BMI 31.47-recommend Dr. Migdalia Dk clinic  Impaired glucose tolerance-needs to really get motivated about diet exercise and weight loss  History of smoking-has not been able to quit  Metabolic syndrome-needs to diet exercise and lose weight  Situational stress with mother in long-term care facility  Plan: Try to encourage patient to take diet exercise and weight loss seriously.  Follow-up in 6 months.  Okay to refill Klonopin.

## 2017-12-28 ENCOUNTER — Encounter: Payer: Self-pay | Admitting: Internal Medicine

## 2017-12-28 ENCOUNTER — Ambulatory Visit: Payer: 59 | Admitting: Internal Medicine

## 2017-12-28 VITALS — BP 120/80 | HR 88 | Temp 98.4°F | Ht 66.0 in | Wt 198.0 lb

## 2017-12-28 DIAGNOSIS — S86911A Strain of unspecified muscle(s) and tendon(s) at lower leg level, right leg, initial encounter: Secondary | ICD-10-CM

## 2017-12-28 DIAGNOSIS — F439 Reaction to severe stress, unspecified: Secondary | ICD-10-CM

## 2017-12-28 DIAGNOSIS — F411 Generalized anxiety disorder: Secondary | ICD-10-CM | POA: Diagnosis not present

## 2017-12-28 DIAGNOSIS — F4321 Adjustment disorder with depressed mood: Secondary | ICD-10-CM | POA: Diagnosis not present

## 2017-12-28 MED ORDER — MELOXICAM 15 MG PO TABS: 15.0000 mg | ORAL_TABLET | Freq: Every day | ORAL | 0 refills | Status: DC

## 2017-12-28 NOTE — Patient Instructions (Signed)
Apply ice or heat to right leg.  Take meloxicam 15 mg daily for 7 to 10 days.  Stop aspirin while taking meloxicam.  Take Klonopin as needed for anxiety and grief.

## 2017-12-28 NOTE — Progress Notes (Incomplete)
   Subjective:    Patient ID: Anita Wagner, female    DOB: 1955-03-19, 62 y.o.   MRN: [id number]  HPI 62 year old Female was at her hairdresser yesterday when she felt an acute pain in her right lateral leg like a cramp.  Later noticed some redness and a bit of fullness in the mid right lateral leg which was tender.  She was concerned about DVT.  She does take aspirin daily.  Has prior history of DVT that was associated with smoking taking estrogen and driving long distances.  Her mother has dementia and has stopped eating.  She expects she will pass away soon.  This is stressful for the patient and she spent most of the visit speaking about this today.  Appropriate grief.  Has Klonopin for anxiety.    Review of Systems see above     Objective:   Physical Exam  $0.50 sized erythematous area that is tender to touch right lateral leg closer to the tibia than the calf.  Homans sign is negative.      Assessment & Plan:  I think this is probably a muscle strain-  Plan: Stop aspirin and take meloxicam 15 mg daily for 7 to 10 days.  Apply ice or heat to the area whichever feels best.  We discussed doing a Doppler study but she declines at this time.  Situational stress and grief reaction-  Plan: Has Klonopin on hand for anxiety  25 minutes spent with patient mainly speaking about mother's illness and appropriate grief reaction.  She is tearful in the office today.   Has Klonopin on hand for anxiety.

## 2017-12-28 NOTE — Progress Notes (Signed)
   Subjective:    Patient ID: Anita Wagner, female    DOB: 05/03/1955, 62 y.o.   MRN: [id number]  HPI    Review of Systems     Objective:   Physical Exam        Assessment & Plan:

## 2018-01-20 ENCOUNTER — Other Ambulatory Visit: Payer: Self-pay | Admitting: Internal Medicine

## 2018-01-28 ENCOUNTER — Other Ambulatory Visit: Payer: Self-pay | Admitting: Internal Medicine

## 2018-03-19 ENCOUNTER — Other Ambulatory Visit: Payer: 59 | Admitting: Internal Medicine

## 2018-03-22 ENCOUNTER — Encounter: Payer: 59 | Admitting: Internal Medicine

## 2018-04-10 ENCOUNTER — Other Ambulatory Visit: Payer: Self-pay

## 2018-04-10 NOTE — Telephone Encounter (Signed)
Received a medication refill through fax from her pharmacy, last refill 10/02/17, she has a CPE scheduled in June.

## 2018-04-11 MED ORDER — CLONAZEPAM 0.5 MG PO TABS: 0.5000 mg | ORAL_TABLET | Freq: Two times a day (BID) | ORAL | 2 refills | Status: DC | PRN

## 2018-06-24 ENCOUNTER — Other Ambulatory Visit: Payer: 59 | Admitting: Internal Medicine

## 2018-06-25 ENCOUNTER — Other Ambulatory Visit: Payer: Self-pay

## 2018-06-25 ENCOUNTER — Other Ambulatory Visit: Payer: 59 | Admitting: Internal Medicine

## 2018-06-25 DIAGNOSIS — Z13220 Encounter for screening for lipoid disorders: Secondary | ICD-10-CM

## 2018-06-25 DIAGNOSIS — Z1329 Encounter for screening for other suspected endocrine disorder: Secondary | ICD-10-CM

## 2018-06-25 DIAGNOSIS — E7800 Pure hypercholesterolemia, unspecified: Secondary | ICD-10-CM

## 2018-06-25 DIAGNOSIS — R7302 Impaired glucose tolerance (oral): Secondary | ICD-10-CM

## 2018-06-25 LAB — COMPLETE METABOLIC PANEL WITH GFR
AG Ratio: 1.6 (calc) (ref 1.0–2.5)
ALT: 12 U/L (ref 6–29)
AST: 14 U/L (ref 10–35)
Albumin: 4.2 g/dL (ref 3.6–5.1)
Alkaline phosphatase (APISO): 56 U/L (ref 37–153)
BUN: 16 mg/dL (ref 7–25)
CO2: 24 mmol/L (ref 20–32)
Calcium: 9.3 mg/dL (ref 8.6–10.4)
Chloride: 107 mmol/L (ref 98–110)
Creat: 0.67 mg/dL (ref 0.50–0.99)
GFR, Est African American: 108 mL/min/{1.73_m2} (ref >=60)
GFR, Est Non African American: 93 mL/min/{1.73_m2} (ref >=60)
Globulin: 2.6 g/dL (calc) (ref 1.9–3.7)
Glucose, Bld: 103 mg/dL — ABNORMAL HIGH (ref 65–99)
Potassium: 4.2 mmol/L (ref 3.5–5.3)
Sodium: 138 mmol/L (ref 135–146)
Total Bilirubin: 0.4 mg/dL (ref 0.2–1.2)
Total Protein: 6.8 g/dL (ref 6.1–8.1)

## 2018-06-25 LAB — CBC WITH DIFFERENTIAL/PLATELET
Absolute Monocytes: 360 cells/uL (ref 200–950)
Basophils Absolute: 71 cells/uL (ref 0–200)
Basophils Relative: 1.2 %
Eosinophils Absolute: 531 cells/uL — ABNORMAL HIGH (ref 15–500)
Eosinophils Relative: 9 %
HCT: 39 % (ref 35.0–45.0)
Hemoglobin: 13.8 g/dL (ref 11.7–15.5)
Lymphs Abs: 1239 cells/uL (ref 850–3900)
MCH: 31.9 pg (ref 27.0–33.0)
MCHC: 35.4 g/dL (ref 32.0–36.0)
MCV: 90.3 fL (ref 80.0–100.0)
MPV: 11.1 fL (ref 7.5–12.5)
Monocytes Relative: 6.1 %
Neutro Abs: 3699 cells/uL (ref 1500–7800)
Neutrophils Relative %: 62.7 %
Platelets: 249 10*3/uL (ref 140–400)
RBC: 4.32 10*6/uL (ref 3.80–5.10)
RDW: 12.5 % (ref 11.0–15.0)
Total Lymphocyte: 21 %
WBC: 5.9 10*3/uL (ref 3.8–10.8)

## 2018-06-25 LAB — TSH: TSH: 1.47 mIU/L (ref 0.40–4.50)

## 2018-06-25 LAB — LIPID PANEL
Cholesterol: 203 mg/dL — ABNORMAL HIGH (ref <200)
HDL: 49 mg/dL — ABNORMAL LOW (ref >=50)
LDL Cholesterol (Calc): 133 mg/dL (calc) — ABNORMAL HIGH
Non-HDL Cholesterol (Calc): 154 mg/dL (calc) — ABNORMAL HIGH (ref <130)
Total CHOL/HDL Ratio: 4.1 (calc) (ref <5.0)
Triglycerides: 104 mg/dL (ref <150)

## 2018-06-28 ENCOUNTER — Encounter: Payer: Self-pay | Admitting: Internal Medicine

## 2018-06-28 ENCOUNTER — Other Ambulatory Visit: Payer: Self-pay

## 2018-06-28 ENCOUNTER — Ambulatory Visit (INDEPENDENT_AMBULATORY_CARE_PROVIDER_SITE_OTHER): Payer: 59 | Admitting: Internal Medicine

## 2018-06-28 VITALS — BP 128/78 | HR 89 | Temp 97.8°F | Resp 16 | Ht 66.0 in | Wt 198.0 lb

## 2018-06-28 DIAGNOSIS — Z86718 Personal history of other venous thrombosis and embolism: Secondary | ICD-10-CM

## 2018-06-28 DIAGNOSIS — Z6831 Body mass index (BMI) 31.0-31.9, adult: Secondary | ICD-10-CM | POA: Diagnosis not present

## 2018-06-28 DIAGNOSIS — E7800 Pure hypercholesterolemia, unspecified: Secondary | ICD-10-CM | POA: Diagnosis not present

## 2018-06-28 DIAGNOSIS — K219 Gastro-esophageal reflux disease without esophagitis: Secondary | ICD-10-CM

## 2018-06-28 DIAGNOSIS — Z8659 Personal history of other mental and behavioral disorders: Secondary | ICD-10-CM

## 2018-06-28 DIAGNOSIS — Z87891 Personal history of nicotine dependence: Secondary | ICD-10-CM | POA: Diagnosis not present

## 2018-06-28 DIAGNOSIS — Z0000 Encounter for general adult medical examination without abnormal findings: Secondary | ICD-10-CM

## 2018-06-28 DIAGNOSIS — F411 Generalized anxiety disorder: Secondary | ICD-10-CM

## 2018-06-28 LAB — POCT URINALYSIS DIP (CLINITEK)
Bilirubin, UA: NEGATIVE
Blood, UA: NEGATIVE
Glucose, UA: NEGATIVE mg/dL
Ketones, POC UA: NEGATIVE mg/dL
Leukocytes, UA: NEGATIVE
Nitrite, UA: NEGATIVE
POC PROTEIN,UA: NEGATIVE
Spec Grav, UA: 1.015 (ref 1.010–1.025)
Urobilinogen, UA: 0.2 E.U./dL
pH, UA: 5 (ref 5.0–8.0)

## 2018-07-04 ENCOUNTER — Telehealth: Payer: Self-pay | Admitting: Internal Medicine

## 2018-07-04 NOTE — Telephone Encounter (Signed)
Schedule OV

## 2018-07-04 NOTE — Telephone Encounter (Signed)
See tomorrow

## 2018-07-04 NOTE — Telephone Encounter (Signed)
Anita Wagner [phone]  Manisha called to say she forgot to mention she has a knot on her left arm around her elbow area and now she also has some swelling on her right ankle, she does not think it is like when she had the blood clot.

## 2018-07-05 ENCOUNTER — Ambulatory Visit: Payer: 59 | Admitting: Internal Medicine

## 2018-07-05 ENCOUNTER — Encounter: Payer: Self-pay | Admitting: Internal Medicine

## 2018-07-05 ENCOUNTER — Other Ambulatory Visit: Payer: Self-pay

## 2018-07-05 VITALS — BP 110/60 | HR 103 | Temp 98.5°F | Ht 66.0 in | Wt 200.0 lb

## 2018-07-05 DIAGNOSIS — D1722 Benign lipomatous neoplasm of skin and subcutaneous tissue of left arm: Secondary | ICD-10-CM

## 2018-07-05 DIAGNOSIS — R609 Edema, unspecified: Secondary | ICD-10-CM | POA: Diagnosis not present

## 2018-07-07 NOTE — Progress Notes (Signed)
   Subjective:    Patient ID: Anita Wagner, female    DOB: January 21, 1955, 63 y.o.   MRN: [id number]  HPI 63 year old Female with history of anxiety in today with a couple of issues.  Has noticed finger swelling recently.  She is working from home sitting most of the day on the computer.  Needs to get up and walk some and watch salt intake.  Explained to her the mechanism of the dependent edema.  Unlikely she will wear support stockings.  Is also noted left elbow palpable abnormality near medial epicondyle.  This appears to be a small irregular nontender palpable mass about 1.5 in length and I think it is consistent with a lipoma.    Review of Systems see above.  No chest pain or shortness of breath.     Objective:   Physical Exam  Right foot puffier than left foot.  History of DVT in right lower extremity which could have caused some venous insufficiency.  Palpable nontender abnormality about 1.5cm in length seems to be in the subcutaneous tissue and consistent with lipoma.      Assessment & Plan:  Lipoma left elbow area  Dependent edema  Remote history of DVT  History of anxiety  Plan: Patient reassured that abnormality near her left elbow is benign.  Dependent edema is not unusual in hot weather and sitting for long periods of time with probable mild venous insufficiency from DVTs with right foot swelling more than left foot.  It is nonpitting dependent edema.  She has no evidence of congestive heart failure.  Advised walking more to combat edema and watch salt intake.  She does not need a diuretic.  20 minutes spent with patient.

## 2018-07-07 NOTE — Patient Instructions (Signed)
Try to get more exercise and elevate feet when possible.  Watch salt intake.  Palpable abnormality left elbow is likely a benign lipoma.

## 2018-07-19 ENCOUNTER — Other Ambulatory Visit: Payer: Self-pay | Admitting: Internal Medicine

## 2018-07-23 ENCOUNTER — Other Ambulatory Visit: Payer: Self-pay | Admitting: Internal Medicine

## 2018-07-27 NOTE — Patient Instructions (Signed)
It was a pleasure to see you today.  Continue current medications and follow-up in 6 months.  Please try to watch diet and lose weight.  Need to exercise regularly.  Please quit smoking.

## 2018-07-27 NOTE — Progress Notes (Signed)
Subjective:    Patient ID: Anita Wagner, female    DOB: 1955/06/30, 63 y.o.   MRN: [id number]  HPI 63 year old Female for health maintenance exam and evaluation of medical issues.  Longstanding history of anxiety treated with Klonopin.  History of acute diverticulitis September 2016.  Continues to have issues with her weight.  Discussion regarding diet exercise and weight loss.  History of right lower extremity DVT December 2015 related to taking a long car trip, estrogen replacement and smoking.  Unfortunately continues to smoke.  Multiple drug intolerances including Augmentin, amoxicillin, sulfa, Levaquin.  Intolerant of erythromycin but can take Zithromax.  History of GE reflux and allergic rhinitis which are both longstanding.  Tonsillectomy at age 58, laparoscopic surgery for endometriosis 1997, total abdominal hysterectomy BSO for endometriosis July 2004.  Tetanus immunization given 2013.  Benign left needle biopsy 2004.  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  Husband is a retired Barrister's clerk.  She is no longer traveling extensively out of town.  Is smoked for well over 20 years.  1 glass of wine daily.  No children.  This is her second marriage.  First marriage ended in divorce.  Family history: Mother passed away with history of dementia, congestive heart failure and arthritis.  One brother with history of bipolar disorder.  Father died in 76 with lung cancer.    Review of Systems  Constitutional: Negative.   Respiratory: Negative.   Cardiovascular: Negative.   Gastrointestinal: Negative.   Genitourinary: Negative.   All other systems reviewed and are negative.      Objective:   Physical Exam Vitals signs reviewed.  Constitutional:      General: She is not in acute distress.    Appearance: Normal appearance. She is not ill-appearing.  HENT:     Head: Normocephalic and atraumatic.     Right Ear: Tympanic membrane  normal.     Left Ear: Tympanic membrane normal.     Nose: Nose normal.     Mouth/Throat:     Mouth: Mucous membranes are moist.     Pharynx: Oropharynx is clear.  Eyes:     General: No scleral icterus.    Extraocular Movements: Extraocular movements intact.     Conjunctiva/sclera: Conjunctivae normal.     Pupils: Pupils are equal, round, and reactive to light.  Neck:     Musculoskeletal: Neck supple. No neck rigidity.     Vascular: No carotid bruit.     Comments: No carotid bruits.  No thyromegaly Cardiovascular:     Rate and Rhythm: Normal rate and regular rhythm.     Pulses: Normal pulses.     Heart sounds: Normal heart sounds. No murmur.  Pulmonary:     Effort: No respiratory distress.     Breath sounds: Normal breath sounds. No wheezing or rales.     Comments: Breasts normal female Abdominal:     General: Bowel sounds are normal.     Palpations: Abdomen is soft. There is no mass.     Tenderness: There is no rebound.  Genitourinary:    Comments: Deferred status post TAH/BSO Musculoskeletal:     Right lower leg: No edema.     Left lower leg: No edema.  Lymphadenopathy:     Cervical: No cervical adenopathy.  Skin:    General: Skin is warm and dry.     Findings: No rash.  Neurological:     General: No focal deficit present.  Mental Status: She is alert and oriented to person, place, and time.     Cranial Nerves: No cranial nerve deficit.     Sensory: No sensory deficit.     Motor: No weakness.  Psychiatric:        Mood and Affect: Mood normal.        Behavior: Behavior normal.        Thought Content: Thought content normal.        Judgment: Judgment normal.           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  Improvement from a year ago when total cholesterol was 220 and LDL cholesterol 142.  LDL is now 133 and total cholesterol 203.  Low HDL at 3  Counseled regarding diet exercise weight loss and smoking cessation.  Return in 6 months.  Continue PPI and Klonopin

## 2018-09-12 ENCOUNTER — Other Ambulatory Visit: Payer: Self-pay

## 2018-09-12 ENCOUNTER — Encounter: Payer: Self-pay | Admitting: Internal Medicine

## 2018-09-12 ENCOUNTER — Ambulatory Visit (INDEPENDENT_AMBULATORY_CARE_PROVIDER_SITE_OTHER): Payer: 59 | Admitting: Internal Medicine

## 2018-09-12 VITALS — BP 123/78 | Temp 98.6°F | Ht 66.0 in | Wt 199.0 lb

## 2018-09-12 DIAGNOSIS — H6500 Acute serous otitis media, unspecified ear: Secondary | ICD-10-CM

## 2018-09-12 MED ORDER — AZITHROMYCIN 250 MG PO TABS: ORAL_TABLET | ORAL | 0 refills | Status: DC

## 2018-09-12 NOTE — Progress Notes (Signed)
   Subjective:    Patient ID: Anita Wagner, female    DOB: 1955/11/24, 63 y.o.   MRN: [id number]  HPI 63 year old Female seen today by [employer] due to the coronavirus pandemic.  She is identified as Anita Wagner, using 2 identifiers who is a longstanding patient in this practice.  Patient has a history of anxiety.  She has a history of recurrent serous otitis media and is complaining of ear congestion and muffling once again.  May be postnasal drainage causing serous otitis media.    Review of Systems ear congestion and muffling     Objective:   Physical Exam  Not examined but by history sounds like serous otitis media from which she has prior history.  Reports she is afebrile.      Assessment & Plan:  Probable serous otitis media  Plan: Zithromax Z-PAK take 2 tablets day 1 followed by 1 tablet days 2 through 5.

## 2018-09-12 NOTE — Patient Instructions (Addendum)
Take Zithromax Z-PAK 2 tablets p.o. day 1 followed by 1 p.o. days 2 through 5.

## 2018-11-19 ENCOUNTER — Other Ambulatory Visit: Payer: Self-pay | Admitting: Internal Medicine

## 2018-12-03 ENCOUNTER — Other Ambulatory Visit: Payer: Self-pay | Admitting: Internal Medicine

## 2018-12-03 DIAGNOSIS — Z1231 Encounter for screening mammogram for malignant neoplasm of breast: Secondary | ICD-10-CM

## 2018-12-04 ENCOUNTER — Other Ambulatory Visit: Payer: Self-pay

## 2018-12-04 ENCOUNTER — Ambulatory Visit: Payer: 59

## 2018-12-04 ENCOUNTER — Ambulatory Visit
Admission: RE | Admit: 2018-12-04 | Discharge: 2018-12-04 | Disposition: A | Discharge status: Home / Self Care | Payer: 59 | Source: Ambulatory Visit | Attending: Internal Medicine | Admitting: Internal Medicine

## 2018-12-04 DIAGNOSIS — Z1231 Encounter for screening mammogram for malignant neoplasm of breast: Secondary | ICD-10-CM

## 2018-12-27 ENCOUNTER — Other Ambulatory Visit: Payer: 59 | Admitting: Internal Medicine

## 2018-12-30 ENCOUNTER — Ambulatory Visit: Payer: 59 | Admitting: Internal Medicine

## 2019-01-17 ENCOUNTER — Other Ambulatory Visit: Payer: Self-pay | Admitting: Internal Medicine

## 2019-04-16 ENCOUNTER — Other Ambulatory Visit: Payer: Self-pay | Admitting: Internal Medicine

## 2019-04-18 ENCOUNTER — Telehealth: Payer: Self-pay | Admitting: Internal Medicine

## 2019-04-18 NOTE — Telephone Encounter (Signed)
Patient called and schedule her CPE for June

## 2019-06-12 DIAGNOSIS — M792 Neuralgia and neuritis, unspecified: Secondary | ICD-10-CM | POA: Diagnosis not present

## 2019-06-12 DIAGNOSIS — M21612 Bunion of left foot: Secondary | ICD-10-CM | POA: Diagnosis not present

## 2019-06-12 DIAGNOSIS — G5761 Lesion of plantar nerve, right lower limb: Secondary | ICD-10-CM | POA: Diagnosis not present

## 2019-06-12 DIAGNOSIS — M79671 Pain in right foot: Secondary | ICD-10-CM | POA: Diagnosis not present

## 2019-06-12 DIAGNOSIS — M21611 Bunion of right foot: Secondary | ICD-10-CM | POA: Diagnosis not present

## 2019-06-22 ENCOUNTER — Other Ambulatory Visit: Payer: Self-pay | Admitting: Internal Medicine

## 2019-06-24 ENCOUNTER — Encounter: Payer: Self-pay | Admitting: Internal Medicine

## 2019-06-26 ENCOUNTER — Other Ambulatory Visit: Payer: Self-pay

## 2019-06-26 ENCOUNTER — Other Ambulatory Visit: Payer: Medicare Other | Admitting: Internal Medicine

## 2019-06-26 VITALS — Ht 66.0 in

## 2019-06-26 DIAGNOSIS — E7800 Pure hypercholesterolemia, unspecified: Secondary | ICD-10-CM | POA: Diagnosis not present

## 2019-06-26 DIAGNOSIS — K219 Gastro-esophageal reflux disease without esophagitis: Secondary | ICD-10-CM

## 2019-06-26 DIAGNOSIS — Z86718 Personal history of other venous thrombosis and embolism: Secondary | ICD-10-CM

## 2019-06-26 DIAGNOSIS — Z1329 Encounter for screening for other suspected endocrine disorder: Secondary | ICD-10-CM

## 2019-06-26 DIAGNOSIS — F411 Generalized anxiety disorder: Secondary | ICD-10-CM

## 2019-06-26 DIAGNOSIS — Z0000 Encounter for general adult medical examination without abnormal findings: Secondary | ICD-10-CM

## 2019-06-26 DIAGNOSIS — R7302 Impaired glucose tolerance (oral): Secondary | ICD-10-CM | POA: Diagnosis not present

## 2019-06-27 LAB — CBC WITH DIFFERENTIAL/PLATELET
Absolute Monocytes: 422 cells/uL (ref 200–950)
Basophils Absolute: 50 cells/uL (ref 0–200)
Basophils Relative: 0.8 %
Eosinophils Absolute: 775 cells/uL — ABNORMAL HIGH (ref 15–500)
Eosinophils Relative: 12.3 %
HCT: 39.8 % (ref 35.0–45.0)
Hemoglobin: 13.8 g/dL (ref 11.7–15.5)
Lymphs Abs: 1285 cells/uL (ref 850–3900)
MCH: 31.7 pg (ref 27.0–33.0)
MCHC: 34.7 g/dL (ref 32.0–36.0)
MCV: 91.5 fL (ref 80.0–100.0)
MPV: 10.9 fL (ref 7.5–12.5)
Monocytes Relative: 6.7 %
Neutro Abs: 3767 cells/uL (ref 1500–7800)
Neutrophils Relative %: 59.8 %
Platelets: 233 10*3/uL (ref 140–400)
RBC: 4.35 10*6/uL (ref 3.80–5.10)
RDW: 12.3 % (ref 11.0–15.0)
Total Lymphocyte: 20.4 %
WBC: 6.3 10*3/uL (ref 3.8–10.8)

## 2019-06-27 LAB — LIPID PANEL
Cholesterol: 187 mg/dL (ref <200)
HDL: 54 mg/dL (ref >=50)
LDL Cholesterol (Calc): 111 mg/dL (calc) — ABNORMAL HIGH
Non-HDL Cholesterol (Calc): 133 mg/dL (calc) — ABNORMAL HIGH (ref <130)
Total CHOL/HDL Ratio: 3.5 (calc) (ref <5.0)
Triglycerides: 111 mg/dL (ref <150)

## 2019-06-27 LAB — HEMOGLOBIN A1C
Hgb A1c MFr Bld: 5.6 % of total Hgb (ref <5.7)
Mean Plasma Glucose: 114 (calc)
eAG (mmol/L): 6.3 (calc)

## 2019-06-27 LAB — COMPLETE METABOLIC PANEL WITH GFR
AG Ratio: 1.7 (calc) (ref 1.0–2.5)
ALT: 16 U/L (ref 6–29)
AST: 16 U/L (ref 10–35)
Albumin: 4.3 g/dL (ref 3.6–5.1)
Alkaline phosphatase (APISO): 56 U/L (ref 37–153)
BUN: 11 mg/dL (ref 7–25)
CO2: 28 mmol/L (ref 20–32)
Calcium: 9.2 mg/dL (ref 8.6–10.4)
Chloride: 106 mmol/L (ref 98–110)
Creat: 0.67 mg/dL (ref 0.50–0.99)
GFR, Est African American: 107 mL/min/{1.73_m2} (ref >=60)
GFR, Est Non African American: 92 mL/min/{1.73_m2} (ref >=60)
Globulin: 2.6 g/dL (calc) (ref 1.9–3.7)
Glucose, Bld: 104 mg/dL — ABNORMAL HIGH (ref 65–99)
Potassium: 4.3 mmol/L (ref 3.5–5.3)
Sodium: 140 mmol/L (ref 135–146)
Total Bilirubin: 0.4 mg/dL (ref 0.2–1.2)
Total Protein: 6.9 g/dL (ref 6.1–8.1)

## 2019-06-27 LAB — TSH: TSH: 1.59 mIU/L (ref 0.40–4.50)

## 2019-06-30 ENCOUNTER — Ambulatory Visit (INDEPENDENT_AMBULATORY_CARE_PROVIDER_SITE_OTHER): Payer: Medicare Other | Admitting: Internal Medicine

## 2019-06-30 ENCOUNTER — Other Ambulatory Visit: Payer: Self-pay

## 2019-06-30 ENCOUNTER — Encounter: Payer: Self-pay | Admitting: Internal Medicine

## 2019-06-30 VITALS — BP 120/80 | HR 88 | Ht 66.0 in | Wt 200.0 lb

## 2019-06-30 DIAGNOSIS — Z23 Encounter for immunization: Secondary | ICD-10-CM

## 2019-06-30 DIAGNOSIS — Z6832 Body mass index (BMI) 32.0-32.9, adult: Secondary | ICD-10-CM | POA: Diagnosis not present

## 2019-06-30 DIAGNOSIS — E7800 Pure hypercholesterolemia, unspecified: Secondary | ICD-10-CM | POA: Diagnosis not present

## 2019-06-30 DIAGNOSIS — Z8659 Personal history of other mental and behavioral disorders: Secondary | ICD-10-CM

## 2019-06-30 DIAGNOSIS — Z0000 Encounter for general adult medical examination without abnormal findings: Secondary | ICD-10-CM | POA: Diagnosis not present

## 2019-06-30 DIAGNOSIS — Z87891 Personal history of nicotine dependence: Secondary | ICD-10-CM

## 2019-06-30 DIAGNOSIS — Z86718 Personal history of other venous thrombosis and embolism: Secondary | ICD-10-CM

## 2019-06-30 LAB — POCT URINALYSIS DIPSTICK
Appearance: NEGATIVE
Bilirubin, UA: NEGATIVE
Blood, UA: NEGATIVE
Glucose, UA: NEGATIVE
Ketones, UA: NEGATIVE
Leukocytes, UA: NEGATIVE
Nitrite, UA: NEGATIVE
Odor: NEGATIVE
Protein, UA: NEGATIVE
Spec Grav, UA: 1.01 (ref 1.010–1.025)
Urobilinogen, UA: 0.2 E.U./dL
pH, UA: 6.5 (ref 5.0–8.0)

## 2019-06-30 NOTE — Patient Instructions (Addendum)
It was a pleasure to see you today.Prevnar 13 given today.  Order written for Shingrix vaccine.  Return in 6 months.  Congratulations on retirement.  Continue to work on diet exercise and weight loss.

## 2019-06-30 NOTE — Progress Notes (Signed)
Subjective:    Patient ID: Anita Wagner, female    DOB: 1955-08-23, 64 y.o.   MRN: [id number]  HPI 64 year old Female for Welcome to Medicare physiacal exam and evaluation of medical issues.  Patient has longstanding history of anxiety treated with Klonopin.  She recently retired and has been quite happy and relaxed.  History of GE reflux treated with Dexilant.  History of musculoskeletal pain treated with Mobic.  Remote history of DVT December 2015 related to taking a long car trip, estrogen replacement and smoking.  Multiple drug intolerances including Augmentin, amoxicillin, sulfa, Levaquin.  Intolerant of erythromycin but can take Zithromax.  History of GE reflux and allergic rhinitis which are longstanding.  History of eosinophilia.  Continues to have issues with her weight.  Discussion regarding diet exercise and weight loss and hopefully now that she is retired she can work on this.  Benign left needle breast biopsy 2004.  Social history: She has a high school education and retired as a Manufacturing engineer.  Husband is retired Barrister's clerk.  She smoked for well over 20 years.  1 glass of wine daily.  This is her second marriage.  No children.  First marriage ended in divorce.  Family history: Mother passed away with history of dementia, congestive heart failure and arthritis.  1 brother with history of bipolar disorder.  Father died in 6 with lung cancer.    Review of Systems had neuroma right foot and had injections in June from podiatrist locally.     Objective:   Physical Exam Blood pressure 120/80, pulse 88, pulse oximetry 95% weight 200 pounds, BMI 32.28  Skin warm and dry.  No cervical adenopathy.  No thyromegaly.  Wax in right TM.  Left TM clear.  She wears bilateral hearing aids.  Neck is supple.  No carotid bruits.  Chest clear to auscultation without rales or wheezing.  Cardiac exam regular rate and rhythm without murmurs or gallops.  Abdomen  soft nondistended obese without hepatosplenomegaly masses or tenderness.  No masses on bimanual exam.  Pap deferred due to TAH/BSO.  No lower extremity edema.  Neuro intact without focal deficits.       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  BMI 32.28  Anxiety disorder treated with Klonopin  Status post TAH/BSO  Pure hypercholesterolemia-total cholesterol is now normal and LDL cholesterol has improved from 1 33-1 11.  HDL is 54.  History of impaired glucose tolerance.  Last year hemoglobin A1c was 5.7% and this year it is normal.  Plan: Continue to work on diet exercise and weight loss and follow-up in 6 months at which time, we will obtain hemoglobin A1c and fasting lipid panel.  Continue Dexilant and Klonopin.  Subjective:   Patient presents for Medicare Annual/Subsequent preventive examination.  Review Past Medical/Family/Social: See above   Risk Factors  Current exercise habits: Currently walking daily Dietary issues discussed: Low-fat low carbohydrate  Cardiac risk factors: Hyperlipidemia and family history mother, history of smoking  Depression Screen  (Note: if answer to either of the following is "Yes", a more complete depression screening is indicated)   Over the past two weeks, have you felt down, depressed or hopeless? No  Over the past two weeks, have you felt little interest or pleasure in doing things? No  Have you lost interest or pleasure in daily life? No Do you often feel hopeless? No Do you cry easily over simple problems? No   Activities of Daily Living  In your present state of health, do you have any difficulty performing the following activities?:   Driving? No  Managing money? No  Feeding yourself? No  Getting from bed to chair? No  Climbing a flight of  stairs? No  Preparing food and eating?: No  Bathing or showering? No  Getting dressed: No  Getting to the toilet? No  Using the toilet:No  Moving around from place to place: No  In the past year have you fallen or had a near fall?:No  Are you sexually active? No  Do you have more than one partner? No   Hearing Difficulties: No  Do you often ask people to speak up or repeat themselves? No  Do you experience ringing or noises in your ears? No  Do you have difficulty understanding soft or whispered voices? No  Do you feel that you have a problem with memory? No Do you often misplace items? No    Home Safety:  Do you have a smoke alarm at your residence? Yes Do you have grab bars in the bathroom?  Yes Do you have throw rugs in your house?  Yes   Cognitive Testing  Alert? Yes Normal Appearance?Yes  Oriented to person? Yes Place? Yes  Time? Yes  Recall of three objects? Yes  Can perform simple calculations? Yes  Displays appropriate judgment?Yes  Can read the correct time from a watch face?Yes   List the Names of Other Physician/Practitioners you currently use:  See referral list for the physicians patient is currently seeing.  Recently saw podiatrist regarding foot issue/neuromas  Review of Systems: See above  Objective:     General appearance: Appears stated age and  obese  Head: Normocephalic, without obvious abnormality, atraumatic  Eyes: conj clear, EOMi PEERLA  Ears: normal TM's and external ear canals both ears  Nose: Nares normal. Septum midline. Mucosa normal. No drainage or sinus tenderness.  Throat: lips, mucosa, and tongue normal; teeth and gums normal  Neck: no adenopathy, no carotid bruit, no JVD, supple, symmetrical, trachea midline and thyroid not enlarged, symmetric, no tenderness/mass/nodules  No CVA tenderness.  Lungs: clear to auscultation bilaterally  Breasts: normal appearance, no masses  Heart: regular rate and rhythm, S1, S2 normal, no murmur,  click, rub or gallop  Abdomen: soft, non-tender; bowel sounds normal; no masses, no organomegaly  Musculoskeletal: ROM normal in all joints, no crepitus, no deformity, Normal muscle strengthen. Back  is symmetric, no curvature. Skin: Skin color, texture, turgor normal. No rashes or lesions  Lymph nodes: Cervical, supraclavicular, and axillary nodes normal.  Neurologic: CN 2 -12 Normal, Normal symmetric reflexes. Normal coordination and gait  Psych: Alert & Oriented x 3, Mood appear stable.    Assessment:    Annual wellness medicare exam   Plan:    During the course of the visit the patient was educated and counseled about appropriate screening and preventive services including:   Annual mammogram  Annual flu vaccine  Prevnar 13 given today  Tdap is up-to-date  Has had 2 COVID-19 immunizations  Order written for Shingrix vaccine   Patient Instructions (the written plan) was given to the patient.  Medicare Attestation  I have personally reviewed:  The patient's medical and social history  Their use of alcohol, tobacco or illicit drugs  Their current medications and  supplements  The patient's functional ability including ADLs,fall risks, home safety risks, cognitive, and hearing and visual impairment  Diet and physical activities  Evidence for depression or mood disorders  The patient's weight, height, BMI, and visual acuity have been recorded in the chart. I have made referrals, counseling, and provided education to the patient based on review of the above and I have provided the patient with a written personalized care plan for preventive services.

## 2019-07-01 ENCOUNTER — Encounter: Payer: Self-pay | Admitting: Internal Medicine

## 2019-07-10 DIAGNOSIS — G5761 Lesion of plantar nerve, right lower limb: Secondary | ICD-10-CM | POA: Diagnosis not present

## 2019-07-10 DIAGNOSIS — M79671 Pain in right foot: Secondary | ICD-10-CM | POA: Diagnosis not present

## 2019-07-10 DIAGNOSIS — M21612 Bunion of left foot: Secondary | ICD-10-CM | POA: Diagnosis not present

## 2019-07-10 DIAGNOSIS — M792 Neuralgia and neuritis, unspecified: Secondary | ICD-10-CM | POA: Diagnosis not present

## 2019-07-16 ENCOUNTER — Other Ambulatory Visit: Payer: Self-pay | Admitting: Internal Medicine

## 2019-08-21 DIAGNOSIS — M21612 Bunion of left foot: Secondary | ICD-10-CM | POA: Diagnosis not present

## 2019-08-21 DIAGNOSIS — S92351A Displaced fracture of fifth metatarsal bone, right foot, initial encounter for closed fracture: Secondary | ICD-10-CM | POA: Diagnosis not present

## 2019-08-21 DIAGNOSIS — M792 Neuralgia and neuritis, unspecified: Secondary | ICD-10-CM | POA: Diagnosis not present

## 2019-08-21 DIAGNOSIS — G5761 Lesion of plantar nerve, right lower limb: Secondary | ICD-10-CM | POA: Diagnosis not present

## 2019-08-21 DIAGNOSIS — M79671 Pain in right foot: Secondary | ICD-10-CM | POA: Diagnosis not present

## 2019-08-29 DIAGNOSIS — M21612 Bunion of left foot: Secondary | ICD-10-CM | POA: Diagnosis not present

## 2019-08-29 DIAGNOSIS — M79671 Pain in right foot: Secondary | ICD-10-CM | POA: Diagnosis not present

## 2019-08-29 DIAGNOSIS — S92351D Displaced fracture of fifth metatarsal bone, right foot, subsequent encounter for fracture with routine healing: Secondary | ICD-10-CM | POA: Diagnosis not present

## 2019-08-29 DIAGNOSIS — G5761 Lesion of plantar nerve, right lower limb: Secondary | ICD-10-CM | POA: Diagnosis not present

## 2019-09-01 ENCOUNTER — Other Ambulatory Visit: Payer: Self-pay

## 2019-09-01 ENCOUNTER — Telehealth (INDEPENDENT_AMBULATORY_CARE_PROVIDER_SITE_OTHER): Payer: Medicare Other | Admitting: Internal Medicine

## 2019-09-01 ENCOUNTER — Encounter: Payer: Self-pay | Admitting: Internal Medicine

## 2019-09-01 ENCOUNTER — Telehealth: Payer: Self-pay | Admitting: Internal Medicine

## 2019-09-01 VITALS — Ht 66.0 in | Wt 200.0 lb

## 2019-09-01 DIAGNOSIS — Z87891 Personal history of nicotine dependence: Secondary | ICD-10-CM

## 2019-09-01 DIAGNOSIS — Z86718 Personal history of other venous thrombosis and embolism: Secondary | ICD-10-CM

## 2019-09-01 DIAGNOSIS — M84374S Stress fracture, right foot, sequela: Secondary | ICD-10-CM | POA: Diagnosis not present

## 2019-09-01 DIAGNOSIS — Z723 Lack of physical exercise: Secondary | ICD-10-CM | POA: Diagnosis not present

## 2019-09-01 DIAGNOSIS — F411 Generalized anxiety disorder: Secondary | ICD-10-CM

## 2019-09-01 NOTE — Progress Notes (Signed)
   Subjective:    Patient ID: Anita Wagner, female    DOB: 12-24-1955, 64 y.o.   MRN: [id number]  HPI 64 year old Female with remote history of DVT right lower extremity December 2014.  At that time she was on estrogen replacement and was smoking.  She drove a number of hours to [facility] and back and was calling traffic and did not stop to stretch her legs. In the emergency department she was started on Xarelto 15 mg daily.  She was advised to quit smoking but she did come off of estrogen replacement immediately and has not resumed.  She was seen here in June for welcome to Medicare physical exam.  She has a history of anxiety treated with Klonopin.  She has been working from home and spends a fair amount of time sitting.  Has been trying to walk some.  Has continued with smoking.  Patient suffered a stress fracture 7 to 10 days ago after increasing her walking regimen.  Was walking 2 miles every morning.  Was diagnosed with stress fracture right foot and is in a boot.  Currently taking aspirin 81 mg daily.  Due to the Coronavirus pandemic she is seen today via interactive audio and video telecommunications.  She is identified using 2 identifiers as Anita Wagner, a patient in this practice.  She is at home and I am in my office.  Patient is anxious about the possibility of developing blood clots while she is in the nuclear stress fracture and not able to exercise.  We discussed this at length today.  I think the DVT that she had previously in 2014 was related to a prolonged car trip that was complicated by traffic delay.  She was actually in the car for a number of hours.  She is able to get up and move around and she has stopped estrogen replacement.  However continues to smoke.   Review of Systems no complaints of leg pain or shortness of breath.     Objective:   Physical Exam Weight is 200 pounds.  Reports her blood pressure to be normal.  She is seen at home in no  acute distress but anxious when speaking about possibility of developing blood clot due to decreased activity from stress fracture     Assessment & Plan:  Stress fracture right foot due to walking.  Due to history of smoking and postmenopausal state she likely has osteopenia/osteoporosis and will need bone density study when she is out of the boot.  History of DVT-at that time was on estrogen replacement therapy, had prolonged inactivity of a car drive with traffic delay for a number of hours.  Estrogen replacement has since been discontinued.  She does continue to smoke.  It would be okay for her to take aspirin 325 mg daily at the present time.  Continue Klonopin for anxiety  Put an order for bone density study to be done after she is out of her boot.

## 2019-09-01 NOTE — Telephone Encounter (Signed)
scheduled

## 2019-09-01 NOTE — Telephone Encounter (Signed)
Milton Streicher [phone]  Anita Wagner called and would like a virtual visit to discuss what she needs to do to prevent getting a blood clot since she has history of them. She has a stress fracture in right foot, she went to podiatary and had boot put on 10 days ago and had it adjusted this past Thursday. She goes back in 2 weeks. The have advised her to only get up to get something to eat or go to rest room.

## 2019-09-01 NOTE — Telephone Encounter (Signed)
Virtual visit 

## 2019-09-06 NOTE — Patient Instructions (Signed)
It was a pleasure to see you today via virtual visit.  Take aspirin 325 mg daily for the weeks that you are in the boot for stress fracture and probably 2 weeks post boot.  Have bone density study when you have been out of the boot for a couple of weeks.  Smoking cessation.

## 2019-09-11 DIAGNOSIS — M79671 Pain in right foot: Secondary | ICD-10-CM | POA: Diagnosis not present

## 2019-09-11 DIAGNOSIS — M21612 Bunion of left foot: Secondary | ICD-10-CM | POA: Diagnosis not present

## 2019-09-11 DIAGNOSIS — G5761 Lesion of plantar nerve, right lower limb: Secondary | ICD-10-CM | POA: Diagnosis not present

## 2019-09-11 DIAGNOSIS — S92351D Displaced fracture of fifth metatarsal bone, right foot, subsequent encounter for fracture with routine healing: Secondary | ICD-10-CM | POA: Diagnosis not present

## 2019-10-02 DIAGNOSIS — M21612 Bunion of left foot: Secondary | ICD-10-CM | POA: Diagnosis not present

## 2019-10-02 DIAGNOSIS — M79671 Pain in right foot: Secondary | ICD-10-CM | POA: Diagnosis not present

## 2019-10-02 DIAGNOSIS — M21611 Bunion of right foot: Secondary | ICD-10-CM | POA: Diagnosis not present

## 2019-10-02 DIAGNOSIS — S92351D Displaced fracture of fifth metatarsal bone, right foot, subsequent encounter for fracture with routine healing: Secondary | ICD-10-CM | POA: Diagnosis not present

## 2019-10-02 DIAGNOSIS — G5761 Lesion of plantar nerve, right lower limb: Secondary | ICD-10-CM | POA: Diagnosis not present

## 2019-11-03 DIAGNOSIS — H903 Sensorineural hearing loss, bilateral: Secondary | ICD-10-CM | POA: Diagnosis not present

## 2019-11-03 DIAGNOSIS — H6121 Impacted cerumen, right ear: Secondary | ICD-10-CM | POA: Diagnosis not present

## 2019-11-03 DIAGNOSIS — R0982 Postnasal drip: Secondary | ICD-10-CM | POA: Diagnosis not present

## 2019-11-06 DIAGNOSIS — M21612 Bunion of left foot: Secondary | ICD-10-CM | POA: Diagnosis not present

## 2019-11-06 DIAGNOSIS — S92351D Displaced fracture of fifth metatarsal bone, right foot, subsequent encounter for fracture with routine healing: Secondary | ICD-10-CM | POA: Diagnosis not present

## 2019-11-06 DIAGNOSIS — M79671 Pain in right foot: Secondary | ICD-10-CM | POA: Diagnosis not present

## 2019-11-06 DIAGNOSIS — G5761 Lesion of plantar nerve, right lower limb: Secondary | ICD-10-CM | POA: Diagnosis not present

## 2019-12-22 ENCOUNTER — Other Ambulatory Visit: Payer: Medicare Other | Admitting: Internal Medicine

## 2019-12-22 DIAGNOSIS — F439 Reaction to severe stress, unspecified: Secondary | ICD-10-CM

## 2019-12-22 DIAGNOSIS — Z0000 Encounter for general adult medical examination without abnormal findings: Secondary | ICD-10-CM

## 2019-12-22 DIAGNOSIS — R7302 Impaired glucose tolerance (oral): Secondary | ICD-10-CM

## 2019-12-22 DIAGNOSIS — Z6832 Body mass index (BMI) 32.0-32.9, adult: Secondary | ICD-10-CM

## 2019-12-22 DIAGNOSIS — D1722 Benign lipomatous neoplasm of skin and subcutaneous tissue of left arm: Secondary | ICD-10-CM

## 2019-12-22 DIAGNOSIS — E7800 Pure hypercholesterolemia, unspecified: Secondary | ICD-10-CM

## 2019-12-25 ENCOUNTER — Other Ambulatory Visit: Payer: Self-pay | Admitting: Internal Medicine

## 2019-12-25 ENCOUNTER — Ambulatory Visit: Payer: Medicare Other | Admitting: Internal Medicine

## 2019-12-25 DIAGNOSIS — E2839 Other primary ovarian failure: Secondary | ICD-10-CM

## 2019-12-25 DIAGNOSIS — Z723 Lack of physical exercise: Secondary | ICD-10-CM

## 2019-12-25 DIAGNOSIS — M84374S Stress fracture, right foot, sequela: Secondary | ICD-10-CM

## 2019-12-25 DIAGNOSIS — Z87891 Personal history of nicotine dependence: Secondary | ICD-10-CM

## 2019-12-29 ENCOUNTER — Other Ambulatory Visit: Payer: Self-pay | Admitting: Internal Medicine

## 2019-12-30 ENCOUNTER — Other Ambulatory Visit: Payer: Self-pay

## 2019-12-30 ENCOUNTER — Ambulatory Visit
Admission: RE | Admit: 2019-12-30 | Discharge: 2019-12-30 | Disposition: A | Discharge status: Home / Self Care | Payer: Medicare Other | Source: Ambulatory Visit | Attending: Internal Medicine | Admitting: Internal Medicine

## 2019-12-30 DIAGNOSIS — Z0389 Encounter for observation for other suspected diseases and conditions ruled out: Secondary | ICD-10-CM | POA: Diagnosis not present

## 2019-12-30 DIAGNOSIS — Z780 Asymptomatic menopausal state: Secondary | ICD-10-CM | POA: Diagnosis not present

## 2019-12-30 DIAGNOSIS — Z13820 Encounter for screening for osteoporosis: Secondary | ICD-10-CM | POA: Diagnosis not present

## 2020-01-05 ENCOUNTER — Telehealth: Payer: Self-pay | Admitting: Internal Medicine

## 2020-01-05 ENCOUNTER — Ambulatory Visit (INDEPENDENT_AMBULATORY_CARE_PROVIDER_SITE_OTHER): Payer: Medicare Other | Admitting: Internal Medicine

## 2020-01-05 ENCOUNTER — Other Ambulatory Visit: Payer: Self-pay

## 2020-01-05 VITALS — HR 88 | Temp 98.0°F

## 2020-01-05 DIAGNOSIS — R059 Cough, unspecified: Secondary | ICD-10-CM | POA: Diagnosis not present

## 2020-01-05 DIAGNOSIS — J22 Unspecified acute lower respiratory infection: Secondary | ICD-10-CM | POA: Diagnosis not present

## 2020-01-05 DIAGNOSIS — F411 Generalized anxiety disorder: Secondary | ICD-10-CM

## 2020-01-05 DIAGNOSIS — Z87891 Personal history of nicotine dependence: Secondary | ICD-10-CM

## 2020-01-05 MED ORDER — AZITHROMYCIN 250 MG PO TABS: ORAL_TABLET | ORAL | 0 refills | Status: DC

## 2020-01-05 MED ORDER — ALBUTEROL SULFATE HFA 108 (90 BASE) MCG/ACT IN AERS: 2.0000 | INHALATION_SPRAY | Freq: Four times a day (QID) | RESPIRATORY_TRACT | 0 refills | Status: DC | PRN

## 2020-01-05 NOTE — Telephone Encounter (Signed)
Scheduled at 12:30pm. 

## 2020-01-05 NOTE — Telephone Encounter (Signed)
[name] [phone]  Anita Wagner called to for the last 4 days she has had a dry cough and some drainage, she took a home test for COVID this morning and it was negative. No Fever, She has had COVID Booster in October and Flu Shot.

## 2020-01-05 NOTE — Telephone Encounter (Signed)
Car visit

## 2020-01-05 NOTE — Progress Notes (Signed)
   Subjective:    Patient ID: Anita Wagner, female    DOB: 1955/04/12, 64 y.o.   MRN: [id number]  HPI 64 year old Female seen for dry cough and postnasal drip onset about 4 days ago.  She took home Covid test this morning and it was negative.  She has received 3 COVID-19 immunizations and Influenza immunization.  Denies fever.  No known COVID-19 exposure.  Husband is with her but asymptomatic.  She has a history of GE reflux, anxiety, allergic rhinitis, musculoskeletal pain.    Review of Systems see above-no dysgeusia, fever, shaking chills     Objective:   Physical Exam Temperature 98 degrees pulse 88 regular pulse oximetry 96%  Skin warm and dry.  TMs are clear.  Pharynx is clear. COVID-19 test obtained by nasal swab.  Chest is clear to auscultation without rales or wheezing.      Assessment & Plan:  Acute lower respiratory infection-rule out COVID-19 virus infection  History of anxiety-anxiety state  History of smoking  Plan: Zithromax Z-PAK 2 p.o. day 1 followed by 1 p.o. days 2 through 5.  Rest and drink plenty of fluids.  Monitor pulse oximetry at home.  We will notify patient with results of COVID-19 testing as soon as available.  Use albuterol inhaler 2 sprays p.o. 4 times daily.

## 2020-01-06 ENCOUNTER — Encounter: Payer: Self-pay | Admitting: Internal Medicine

## 2020-01-06 NOTE — Patient Instructions (Addendum)
COVID-19 test result is pending.  Take Zithromax Z-PAK 2 p.o. day 1 followed by 1 p.o. days 2 through 5.  Rest and drink plenty of fluids.  Monitor pulse oximetry.  We will notify you of test result for Covid.  Use Ventolin inhaler 2 sprays by mouth 4 times daily

## 2020-01-07 LAB — SARS-COV-2 RNA,(COVID-19) QUALITATIVE NAAT: SARS CoV2 RNA: NOT DETECTED

## 2020-01-15 ENCOUNTER — Other Ambulatory Visit: Payer: Medicare Other | Admitting: Internal Medicine

## 2020-01-15 ENCOUNTER — Other Ambulatory Visit: Payer: Self-pay

## 2020-01-15 DIAGNOSIS — E7800 Pure hypercholesterolemia, unspecified: Secondary | ICD-10-CM | POA: Diagnosis not present

## 2020-01-15 DIAGNOSIS — R7302 Impaired glucose tolerance (oral): Secondary | ICD-10-CM

## 2020-01-16 ENCOUNTER — Encounter: Payer: Self-pay | Admitting: Internal Medicine

## 2020-01-16 ENCOUNTER — Other Ambulatory Visit: Payer: Self-pay | Admitting: Internal Medicine

## 2020-01-16 ENCOUNTER — Ambulatory Visit (INDEPENDENT_AMBULATORY_CARE_PROVIDER_SITE_OTHER): Payer: Medicare Other | Admitting: Internal Medicine

## 2020-01-16 VITALS — BP 112/70 | HR 62 | Ht 66.0 in | Wt 205.0 lb

## 2020-01-16 DIAGNOSIS — Z8659 Personal history of other mental and behavioral disorders: Secondary | ICD-10-CM

## 2020-01-16 DIAGNOSIS — R7302 Impaired glucose tolerance (oral): Secondary | ICD-10-CM | POA: Diagnosis not present

## 2020-01-16 DIAGNOSIS — Z79899 Other long term (current) drug therapy: Secondary | ICD-10-CM | POA: Diagnosis not present

## 2020-01-16 DIAGNOSIS — Z6833 Body mass index (BMI) 33.0-33.9, adult: Secondary | ICD-10-CM

## 2020-01-16 DIAGNOSIS — Z8719 Personal history of other diseases of the digestive system: Secondary | ICD-10-CM

## 2020-01-16 DIAGNOSIS — Z87891 Personal history of nicotine dependence: Secondary | ICD-10-CM | POA: Diagnosis not present

## 2020-01-16 DIAGNOSIS — E7800 Pure hypercholesterolemia, unspecified: Secondary | ICD-10-CM

## 2020-01-16 LAB — LIPID PANEL
Cholesterol: 208 mg/dL — ABNORMAL HIGH (ref <200)
HDL: 55 mg/dL (ref >=50)
LDL Cholesterol (Calc): 129 mg/dL (calc) — ABNORMAL HIGH
Non-HDL Cholesterol (Calc): 153 mg/dL (calc) — ABNORMAL HIGH (ref <130)
Total CHOL/HDL Ratio: 3.8 (calc) (ref <5.0)
Triglycerides: 128 mg/dL (ref <150)

## 2020-01-16 LAB — HEMOGLOBIN A1C
Hgb A1c MFr Bld: 5.8 % of total Hgb — ABNORMAL HIGH (ref <5.7)
Mean Plasma Glucose: 120 mg/dL
eAG (mmol/L): 6.6 mmol/L

## 2020-01-16 NOTE — Progress Notes (Signed)
   Subjective:    Patient ID: Anita Wagner, female    DOB: Jun 07, 1955, 65 y.o.   MRN: [id number]  HPI 65 year old Female for 6 month follow up. Tested for Covid in late December and result was negative.  Had acute lower respiratory infection treated with Zithromax.  In August 10 displaced fracture fifth metatarsal right foot treated by podiatrist.  History of impaired glucose tolerance.  Hemoglobin A1c is 5.8% and was 5.6% 7 months ago.  Treated with diet only.  History of pure hypercholesterolemia.  Total cholesterol is 208, HDL 55, triglycerides 128 and LDL cholesterol 129.  LDL has ranged over the past 3 years between 142 and 111.  Does not want to be on statin medication.  Had welcome to Medicare health maintenance exam in June.   She has had 3 COVID-19 vaccines.  Had flu vaccine in October.  Tetanus immunization is up-to-date.  History of anxiety treated with Klonopin and stable.  History of GE reflux treated with Dexilant.  Continues to smoke.Has gained 7 pounds since June 2020.    Review of Systems no new complaints     Objective:   Physical Exam  Blood pressure 112/70 pulse 62, regular pulse oximetry 98% weight 205 pounds BMI 33.09 height 5 feet 6 inches  Skin is warm and dry.  No cervical adenopathy.  No thyromegaly.  No carotid bruits.  Chest clear to auscultation.  Cardiac exam: Regular rate and rhythm normal S1 and S2 without murmurs or gallops.  No lower extremity pitting edema.  Affect thought and judgment are normal.      Assessment & Plan:  Pure hypercholesterolemia-does not want to be on statin medication.  Continue with diet exercise and weight loss efforts  BMI 33-continue to work on diet and exercise.  Has gained 7 pounds since 2020.  History of smoking-does not want smoking cessation assistance at this time  Anxiety-stable on Klonopin and longstanding  GE reflux treated with Exelon and stable  Remote history of DVT and PE.  No recurrence  History of  impaired glucose tolerance treated with diet alone and stable  Plan: She will be due for health maintenance exam, Medicare wellness visit and fasting labs in 6 months.  Continue current medications.

## 2020-01-27 MED ORDER — CLONAZEPAM 0.5 MG PO TABS: 0.5000 mg | ORAL_TABLET | Freq: Two times a day (BID) | ORAL | 5 refills | Status: DC | PRN

## 2020-01-28 ENCOUNTER — Other Ambulatory Visit: Payer: Self-pay | Admitting: Internal Medicine

## 2020-01-31 NOTE — Patient Instructions (Signed)
It was a pleasure to see you today.  Continue current medications.  Continue to work on diet exercise and weight loss.  Immunizations are up-to-date.  Return in 6 months.

## 2020-02-05 ENCOUNTER — Telehealth: Payer: Self-pay | Admitting: Internal Medicine

## 2020-02-05 NOTE — Telephone Encounter (Signed)
Scheduled

## 2020-02-05 NOTE — Telephone Encounter (Signed)
She will need office visit if no covid symptoms

## 2020-02-05 NOTE — Telephone Encounter (Signed)
Anita Wagner [phone]  Mirissa called to say she has fluid and pain in her left ear that started this morning.

## 2020-02-06 ENCOUNTER — Encounter: Payer: Self-pay | Admitting: Internal Medicine

## 2020-02-06 ENCOUNTER — Ambulatory Visit (INDEPENDENT_AMBULATORY_CARE_PROVIDER_SITE_OTHER): Payer: Medicare Other | Admitting: Internal Medicine

## 2020-02-06 ENCOUNTER — Other Ambulatory Visit: Payer: Self-pay

## 2020-02-06 VITALS — BP 120/80 | HR 84 | Temp 98.0°F | Ht 66.0 in | Wt 202.0 lb

## 2020-02-06 DIAGNOSIS — Z716 Tobacco abuse counseling: Secondary | ICD-10-CM | POA: Diagnosis not present

## 2020-02-06 DIAGNOSIS — H6505 Acute serous otitis media, recurrent, left ear: Secondary | ICD-10-CM | POA: Diagnosis not present

## 2020-02-06 MED ORDER — AZITHROMYCIN 250 MG PO TABS: ORAL_TABLET | ORAL | 0 refills | Status: DC

## 2020-02-06 NOTE — Patient Instructions (Addendum)
Discussion on smoking cessation - cut back gradually over several weeks. Take Zithromaz Z pak for otitis media  2 day 1 followed by one days 2-5.  Agree with trial of weight watchers.

## 2020-02-06 NOTE — Progress Notes (Signed)
   Subjective:    Patient ID: Anita Wagner, female    DOB: 1955/07/15, 65 y.o.   MRN: [id number]  HPI 65 year old Female seen today for left ear pain.  She was seen in late December with dry cough and postnasal drip.  COVID-19 test was negative.  Was felt to have an acute lower respiratory infection and was treated with Zithromax.    She has a history of smoking at least a pack of cigarettes daily.  Asking about Wellbutrin or Chantix.  She her Chantix might cause cancer.  Spent time today talking with her about decreasing number of cigarettes smoked by 1 or 2 daily per week and continue to cut back every week a little bit more gradually until she has cut it out entirely.  Needs to walk for exercise.  Says she is starting weight watchers.  Not happy with her weight.  Thinks that she may smoke more now that she is retired.  I would prefer not to start medication for smoking cessation at this time.  Denies fever or chills.  Has groceries delivered.  Does not have much exposure.  Gets takeout through drive-through's.  She and her husband stay at home mostly.  Would like to see her walking for 20 minutes daily.  She has had 3 COVID-19 immunizations and had flu vaccine in October.    Review of Systems see above     Objective:   Physical Exam Temperature 98 degrees orally blood pressure 120/80 pulse 84 regular weight 202 pounds BMI 32.60  Skin warm and dry.  No cervical adenopathy.  Right TM is clear.  Left TM is full but not red.  Neck is supple.  Chest clear.       Assessment & Plan:  Acute left serous otitis media  History of smoking-longstanding currently about a pack of cigarettes daily-smoking cessation counseling given  History of GE reflux treated with   History of anxiety treated with Klonopin twice daily as needed which is longstanding  Plan: Patient will try to cut back on her smoking gradually.  Would like to see her walking for 20 minutes daily if possible.  Agree with  trial of weight watchers for weight loss.  Take Zithromax Z-PAK for left serous otitis media 2 p.o. day 1 followed by 1 p.o. days 2 through 5

## 2020-04-08 ENCOUNTER — Encounter: Payer: Self-pay | Admitting: Gastroenterology

## 2020-06-09 ENCOUNTER — Telehealth: Payer: Self-pay | Admitting: Internal Medicine

## 2020-06-09 NOTE — Telephone Encounter (Signed)
LVM to CB to reschedule CPE, Dr Renold Genta out of office

## 2020-06-10 NOTE — Telephone Encounter (Signed)
scheduled

## 2020-06-22 ENCOUNTER — Ambulatory Visit (INDEPENDENT_AMBULATORY_CARE_PROVIDER_SITE_OTHER): Payer: Medicare Other | Admitting: Internal Medicine

## 2020-06-22 ENCOUNTER — Other Ambulatory Visit: Payer: Self-pay

## 2020-06-22 ENCOUNTER — Telehealth: Payer: Self-pay | Admitting: Internal Medicine

## 2020-06-22 VITALS — HR 88 | Temp 98.8°F | Resp 12

## 2020-06-22 DIAGNOSIS — J22 Unspecified acute lower respiratory infection: Secondary | ICD-10-CM | POA: Diagnosis not present

## 2020-06-22 DIAGNOSIS — R059 Cough, unspecified: Secondary | ICD-10-CM

## 2020-06-22 DIAGNOSIS — Z8719 Personal history of other diseases of the digestive system: Secondary | ICD-10-CM

## 2020-06-22 DIAGNOSIS — Z8659 Personal history of other mental and behavioral disorders: Secondary | ICD-10-CM | POA: Diagnosis not present

## 2020-06-22 DIAGNOSIS — E7800 Pure hypercholesterolemia, unspecified: Secondary | ICD-10-CM

## 2020-06-22 DIAGNOSIS — R7302 Impaired glucose tolerance (oral): Secondary | ICD-10-CM

## 2020-06-22 DIAGNOSIS — Z87891 Personal history of nicotine dependence: Secondary | ICD-10-CM | POA: Diagnosis not present

## 2020-06-22 MED ORDER — AZITHROMYCIN 250 MG PO TABS: ORAL_TABLET | ORAL | 0 refills | Status: AC

## 2020-06-22 MED ORDER — HYDROCODONE BIT-HOMATROP MBR 5-1.5 MG/5ML PO SOLN: 5.0000 mL | Freq: Three times a day (TID) | ORAL | 0 refills | Status: DC | PRN

## 2020-06-22 NOTE — Telephone Encounter (Signed)
Scheduled

## 2020-06-22 NOTE — Telephone Encounter (Signed)
Anita Wagner  [phone]  [name] called to say she has had a cough for about a week, it is worse at night.I had her do a COVID test, it is negative. No COVID exposure. She has 2 vaccines and 1 booster. She would like to have a video visit if possible.

## 2020-06-22 NOTE — Progress Notes (Signed)
   Subjective:    Patient ID: Anita Wagner, female    DOB: 1955-07-31, 65 y.o.   MRN: [id number]  HPI 65 year old Female seen today in person regarding cough for about a week.  Cough is worse at night.  She had a home COVID test which was negative.  No known COVID exposure.  She has had a total of 3 COVID-19 vaccines.  History of smoking.  Medical issues include anxiety, GE reflux, hyperlipidemia.  History of impaired glucose tolerance    Review of Systems denies fever, chills, nausea, vomiting.  No headache.     Objective:   Physical Exam Temperature 98.8 degrees, pulse 88 regular, respiratory rate 12.  Skin: Warm and dry.  No cervical adenopathy.  TMs clear.  Neck supple.  Chest clear to auscultation without rales or wheezing.  COVID-19 PCR test is negative.  Respiratory virus panel is also negative.     Assessment & Plan:   Acute lower respiratory infection  History of anxiety treated with Klonopin  GE reflux  Hyperlipidemia  Impaired glucose tolerance  History of smoking  Plan: Zithromax Z-PAK 2 tabs day 1 followed by 1 tab days 2 through 5.  Hycodan 1 teaspoon every 8 hours as needed for cough.  She does have a primary inhaler prescribed in January 2022 should she needed for wheezing or shortness of breath.  Call if not improving in 24 to 48 hours or sooner if worse.

## 2020-06-23 LAB — RESPIRATORY VIRUS PANEL

## 2020-06-24 LAB — SARS-COV-2 RNA,(COVID-19) QUALITATIVE NAAT: SARS CoV2 RNA: NOT DETECTED

## 2020-07-01 ENCOUNTER — Other Ambulatory Visit: Payer: Medicare Other | Admitting: Internal Medicine

## 2020-07-02 ENCOUNTER — Encounter: Payer: Medicare Other | Admitting: Internal Medicine

## 2020-07-03 ENCOUNTER — Other Ambulatory Visit: Payer: Self-pay | Admitting: Internal Medicine

## 2020-07-13 ENCOUNTER — Other Ambulatory Visit: Payer: Self-pay | Admitting: Internal Medicine

## 2020-07-28 ENCOUNTER — Encounter: Payer: Self-pay | Admitting: Internal Medicine

## 2020-07-28 NOTE — Patient Instructions (Signed)
Take Zithromax Z-PAK 2 tabs day 1 followed by 1 tab days 2 through 5.  COVID-19 PCR test and respiratory virus panel obtained with results pending.  Hycodan 1 teaspoon every 8 hours as needed for cough.  May use inhaler if necessary for wheezing or shortness of breath.  Call if symptoms not improving in 24 to 48 hours or sooner if worse.  Rest and drink plenty of fluids.

## 2020-08-09 DIAGNOSIS — G5761 Lesion of plantar nerve, right lower limb: Secondary | ICD-10-CM | POA: Diagnosis not present

## 2020-08-09 DIAGNOSIS — L84 Corns and callosities: Secondary | ICD-10-CM | POA: Diagnosis not present

## 2020-08-09 DIAGNOSIS — S92351D Displaced fracture of fifth metatarsal bone, right foot, subsequent encounter for fracture with routine healing: Secondary | ICD-10-CM | POA: Diagnosis not present

## 2020-08-09 DIAGNOSIS — M79671 Pain in right foot: Secondary | ICD-10-CM | POA: Diagnosis not present

## 2020-08-17 ENCOUNTER — Ambulatory Visit (INDEPENDENT_AMBULATORY_CARE_PROVIDER_SITE_OTHER): Payer: Medicare Other | Admitting: Internal Medicine

## 2020-08-17 ENCOUNTER — Telehealth: Payer: Self-pay | Admitting: Internal Medicine

## 2020-08-17 ENCOUNTER — Encounter: Payer: Self-pay | Admitting: Internal Medicine

## 2020-08-17 ENCOUNTER — Other Ambulatory Visit: Payer: Self-pay

## 2020-08-17 VITALS — BP 120/80 | HR 91 | Temp 98.7°F | Ht 66.0 in | Wt 196.0 lb

## 2020-08-17 DIAGNOSIS — H903 Sensorineural hearing loss, bilateral: Secondary | ICD-10-CM

## 2020-08-17 DIAGNOSIS — Z1152 Encounter for screening for COVID-19: Secondary | ICD-10-CM

## 2020-08-17 DIAGNOSIS — H6502 Acute serous otitis media, left ear: Secondary | ICD-10-CM

## 2020-08-17 DIAGNOSIS — H6692 Otitis media, unspecified, left ear: Secondary | ICD-10-CM | POA: Diagnosis not present

## 2020-08-17 DIAGNOSIS — H60502 Unspecified acute noninfective otitis externa, left ear: Secondary | ICD-10-CM

## 2020-08-17 MED ORDER — AZITHROMYCIN 250 MG PO TABS: ORAL_TABLET | ORAL | 1 refills | Status: AC

## 2020-08-17 MED ORDER — NEOMYCIN-POLYMYXIN-HC 3.5-10000-1 OT SOLN: 4.0000 [drp] | Freq: Four times a day (QID) | OTIC | 0 refills | Status: DC

## 2020-08-17 NOTE — Telephone Encounter (Signed)
Anita Wagner [phone]  [name] called to say she started having left ear pain this morning that is running down into her neck. Can't hear well this morning either.

## 2020-08-17 NOTE — Progress Notes (Signed)
   Subjective:    Patient ID: Anita Wagner, female    DOB: 04-22-1955, 65 y.o.   MRN: [id number]  HPI Had maxillary and manibular peridontal surgery some 2-3 weeks ago.  Last night had onset left ear pain and decreased hearing left ear. Does wear hearing aids both ears.  Did Covid test this morning and it was negative. No travel history.  Review of Systems no fever or chills. No nausea. No sore throat or cough     Objective:   Physical Exam  BP 120/80 ,pulse 91, T 98.7, pulse ox 98%, Weight 196 pounds, BMI 31.64 Both TMs look clear to me.Left TM may be slightly fuller than right TM. No redness or fullness. Neck supple without adenopathy.Chest is clear.  Left external canal may be slightly red.  COVID-19 test obtained by nasal swab.     Assessment & Plan:   Left otalgia No URI Acute left serous otitis media Possible left otitis externa   COVID test obtained and proved to be negative  Plan: Zithromax Z-PAK 2 tabs day 1 followed by 1 tab days 2 through 5.  Call if not better in 24 to 48 hours or sooner if worse.

## 2020-08-18 LAB — SARS-COV-2 RNA,(COVID-19) QUALITATIVE NAAT: SARS CoV2 RNA: NOT DETECTED

## 2020-08-30 ENCOUNTER — Other Ambulatory Visit: Payer: Self-pay

## 2020-08-30 ENCOUNTER — Other Ambulatory Visit: Payer: Medicare Other | Admitting: Internal Medicine

## 2020-08-30 DIAGNOSIS — H903 Sensorineural hearing loss, bilateral: Secondary | ICD-10-CM

## 2020-08-30 DIAGNOSIS — F411 Generalized anxiety disorder: Secondary | ICD-10-CM

## 2020-08-30 DIAGNOSIS — Z8719 Personal history of other diseases of the digestive system: Secondary | ICD-10-CM | POA: Diagnosis not present

## 2020-08-30 DIAGNOSIS — Z0000 Encounter for general adult medical examination without abnormal findings: Secondary | ICD-10-CM

## 2020-08-30 DIAGNOSIS — Z87891 Personal history of nicotine dependence: Secondary | ICD-10-CM | POA: Diagnosis not present

## 2020-08-30 DIAGNOSIS — E7800 Pure hypercholesterolemia, unspecified: Secondary | ICD-10-CM | POA: Diagnosis not present

## 2020-08-30 DIAGNOSIS — Z8659 Personal history of other mental and behavioral disorders: Secondary | ICD-10-CM

## 2020-08-30 DIAGNOSIS — R7302 Impaired glucose tolerance (oral): Secondary | ICD-10-CM

## 2020-08-31 LAB — COMPLETE METABOLIC PANEL WITH GFR
AG Ratio: 1.5 (calc) (ref 1.0–2.5)
ALT: 14 U/L (ref 6–29)
AST: 15 U/L (ref 10–35)
Albumin: 4.4 g/dL (ref 3.6–5.1)
Alkaline phosphatase (APISO): 65 U/L (ref 37–153)
BUN: 14 mg/dL (ref 7–25)
CO2: 29 mmol/L (ref 20–32)
Calcium: 9.4 mg/dL (ref 8.6–10.4)
Chloride: 106 mmol/L (ref 98–110)
Creat: 0.64 mg/dL (ref 0.50–1.05)
Globulin: 2.9 g/dL (calc) (ref 1.9–3.7)
Glucose, Bld: 97 mg/dL (ref 65–99)
Potassium: 4.3 mmol/L (ref 3.5–5.3)
Sodium: 142 mmol/L (ref 135–146)
Total Bilirubin: 0.5 mg/dL (ref 0.2–1.2)
Total Protein: 7.3 g/dL (ref 6.1–8.1)
eGFR: 97 mL/min/{1.73_m2} (ref >=60)

## 2020-08-31 LAB — CBC WITH DIFFERENTIAL/PLATELET
Absolute Monocytes: 384 cells/uL (ref 200–950)
Basophils Absolute: 101 cells/uL (ref 0–200)
Basophils Relative: 1.6 %
Eosinophils Absolute: 554 cells/uL — ABNORMAL HIGH (ref 15–500)
Eosinophils Relative: 8.8 %
HCT: 41.9 % (ref 35.0–45.0)
Hemoglobin: 14 g/dL (ref 11.7–15.5)
Lymphs Abs: 1348 cells/uL (ref 850–3900)
MCH: 31.5 pg (ref 27.0–33.0)
MCHC: 33.4 g/dL (ref 32.0–36.0)
MCV: 94.4 fL (ref 80.0–100.0)
MPV: 11 fL (ref 7.5–12.5)
Monocytes Relative: 6.1 %
Neutro Abs: 3912 cells/uL (ref 1500–7800)
Neutrophils Relative %: 62.1 %
Platelets: 241 10*3/uL (ref 140–400)
RBC: 4.44 10*6/uL (ref 3.80–5.10)
RDW: 12.6 % (ref 11.0–15.0)
Total Lymphocyte: 21.4 %
WBC: 6.3 10*3/uL (ref 3.8–10.8)

## 2020-08-31 LAB — HEMOGLOBIN A1C
Hgb A1c MFr Bld: 5.7 % of total Hgb — ABNORMAL HIGH (ref <5.7)
Mean Plasma Glucose: 117 mg/dL
eAG (mmol/L): 6.5 mmol/L

## 2020-08-31 LAB — LIPID PANEL
Cholesterol: 202 mg/dL — ABNORMAL HIGH (ref <200)
HDL: 53 mg/dL (ref >=50)
LDL Cholesterol (Calc): 121 mg/dL (calc) — ABNORMAL HIGH
Non-HDL Cholesterol (Calc): 149 mg/dL (calc) — ABNORMAL HIGH (ref <130)
Total CHOL/HDL Ratio: 3.8 (calc) (ref <5.0)
Triglycerides: 167 mg/dL — ABNORMAL HIGH (ref <150)

## 2020-08-31 LAB — TSH: TSH: 2.09 mIU/L (ref 0.40–4.50)

## 2020-09-02 ENCOUNTER — Ambulatory Visit (INDEPENDENT_AMBULATORY_CARE_PROVIDER_SITE_OTHER): Payer: Medicare Other | Admitting: Internal Medicine

## 2020-09-02 ENCOUNTER — Encounter: Payer: Self-pay | Admitting: Internal Medicine

## 2020-09-02 ENCOUNTER — Other Ambulatory Visit: Payer: Self-pay

## 2020-09-02 VITALS — BP 120/60 | HR 77 | Ht 66.0 in | Wt 200.0 lb

## 2020-09-02 DIAGNOSIS — Z87891 Personal history of nicotine dependence: Secondary | ICD-10-CM | POA: Diagnosis not present

## 2020-09-02 DIAGNOSIS — Z8719 Personal history of other diseases of the digestive system: Secondary | ICD-10-CM | POA: Diagnosis not present

## 2020-09-02 DIAGNOSIS — Z1231 Encounter for screening mammogram for malignant neoplasm of breast: Secondary | ICD-10-CM

## 2020-09-02 DIAGNOSIS — H903 Sensorineural hearing loss, bilateral: Secondary | ICD-10-CM

## 2020-09-02 DIAGNOSIS — R7302 Impaired glucose tolerance (oral): Secondary | ICD-10-CM | POA: Diagnosis not present

## 2020-09-02 DIAGNOSIS — E7800 Pure hypercholesterolemia, unspecified: Secondary | ICD-10-CM | POA: Diagnosis not present

## 2020-09-02 DIAGNOSIS — Z86718 Personal history of other venous thrombosis and embolism: Secondary | ICD-10-CM | POA: Diagnosis not present

## 2020-09-02 DIAGNOSIS — Z8659 Personal history of other mental and behavioral disorders: Secondary | ICD-10-CM | POA: Diagnosis not present

## 2020-09-02 DIAGNOSIS — Z0000 Encounter for general adult medical examination without abnormal findings: Secondary | ICD-10-CM

## 2020-09-02 MED ORDER — CLONAZEPAM 0.5 MG PO TABS: 0.5000 mg | ORAL_TABLET | Freq: Two times a day (BID) | ORAL | 5 refills | Status: DC | PRN

## 2020-09-02 NOTE — Progress Notes (Signed)
Subjective:    Patient ID: Anita Wagner, female    DOB: [birth year], 65 y.o.   MRN: [id number]  HPI 65 year old Female for Medicare wellness, health maintenance exam and evaluation of medical issues.  Patient has gained weight. Recommend [facility].  Longstanding history of anxiety treated with Klonopin.  She is retired now.  History of GE reflux treated with Dexilant.  History of musculoskeletal pain treated with Mobic.  Remote history of DVT in December 2015 related to taking a long car trip, estrogen replacement and smoking.  Multiple drug intolerances including Augmentin, amoxicillin, sulfa, Levaquin.  Intolerant of erythromycin but can take Zithromax.  History of allergic rhinitis which is longstanding.  History of eosinophilia.  History of sensorineural hearing loss  Benign left breast needle biopsy 2004.  History of Morton's neuroma right foot and had injections in 2021 from podiatrist.  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  Husband is a retired Barrister's clerk.  Has smoked for well over 20 years.  1 glass of wine daily.  This is her second marriage.  No children.  First marriage ended in divorce.  Family history: Mother passed away with history of dementia, congestive heart failure and arthritis.  Father died in 31 with lung cancer.  1 brother with history of bipolar disorder.  Review of Systems no new complaints-no chest pain, shortness of breath, anxiety is better since she retired     Objective:   Physical Exam Blood pressure 120/60 pulse 77 pulse oximetry 97% weight 200 pounds height 5 feet 6 inches BMI 32.28  Skin: Warm and dry.  No cervical adenopathy.  No thyromegaly.  No carotid bruits.  Chest clear to auscultation.  Cardiac exam: Regular rate and rhythm without ectopy or murmur.  Abdomen soft nondistended without hepatosplenomegaly masses or tenderness.  Bimanual exam deferred status  post TAH/BSO.  No lower extremity edema.  Neuro intact without focal deficits.  Affect thought and judgment are normal.       Assessment & Plan:   Bilateral hearing loss-wears bilateral hearing aids  History of smoking-once again ask her to quit smoking  Remote history of DVT x1 without recurrence  GE reflux treated with Dexilant    Obesity-needs to work on diet, exercise and weight loss BMI 32.28.  Could consider COVID healthy weight loss clinic  Anxiety disorder treated with Klonopin  Status post TAH/BSO  Pure hypercholesterolemia.  Her triglycerides have increased from 128 to 167.  Impaired glucose tolerance-stable at 5.7%  Plan: Continue to encourage her to work on diet exercise and weight loss.  Continue current medications and follow-up in 3 months at her request. Referral for repeat colonoscopy Dr. Andrey Cota   Subjective:   Patient presents for Medicare Annual/Subsequent preventive examination.  Review Past Medical/Family/Social:see above   Risk Factors  Current exercise habits: walks Dietary issues discussed: yes  Cardiac risk factors:hyperlipidemia, smoking, family Hx  Depression Screen  (Note: if answer to either of the following is "Yes", a more complete depression screening is indicated)   Over the past two weeks, have you felt down, depressed or hopeless? No  Over the past two weeks, have you felt little interest or pleasure in doing things? No Have you lost interest or pleasure in daily life? No Do you often feel hopeless? No Do you cry easily over simple problems? No   Activities of Daily Living  In your present state of health, do you  have any difficulty performing the following activities?:   Driving? No  Managing money? No  Feeding yourself? No  Getting from bed to chair? No  Climbing a flight of stairs? No  Preparing food and eating?: No  Bathing or showering? No  Getting dressed: No  Getting to the toilet? No  Using the toilet:No   Moving around from place to place: No  In the past year have you fallen or had a near fall?:No  Are you sexually active? No  Do you have more than one partner? No   Hearing Difficulties: yes wears hearing aids Do you often ask people to speak up or repeat themselves? seldom Do you experience ringing or noises in your ears? No  Do you have difficulty understanding soft or whispered voices? No  Do you feel that you have a problem with memory? No Do you often misplace items? No    Home Safety:  Do you have a smoke alarm at your residence? Yes Do you have grab bars in the bathroom?no Do you have throw rugs in your house?yes   Cognitive Testing  Alert? Yes Normal Appearance?Yes  Oriented to person? Yes Place? Yes  Time? Yes  Recall of three objects? Yes  Can perform simple calculations? Yes  Displays appropriate judgment?Yes  Can read the correct time from a watch face?Yes   List the Names of Other Physician/Practitioners you currently use:  See referral list for the physicians patient is currently seeing.     Review of Systems: see above   Objective:     General appearance: Appears stated age and mildly obese  Head: Normocephalic, without obvious abnormality, atraumatic  Eyes: conj clear, EOMi PEERLA  Ears: normal TM's and external ear canals both ears  Nose: Nares normal. Septum midline. Mucosa normal. No drainage or sinus tenderness.  Throat: lips, mucosa, and tongue normal; teeth and gums normal  Neck: no adenopathy, no carotid bruit, no JVD, supple, symmetrical, trachea midline and thyroid not enlarged, symmetric, no tenderness/mass/nodules  No CVA tenderness.  Lungs: clear to auscultation bilaterally  Breasts: normal appearance, no masses or tenderness Heart: regular rate and rhythm, S1, S2 normal, no murmur, click, rub or gallop  Abdomen: soft, non-tender; bowel sounds normal; no masses, no organomegaly  Musculoskeletal: ROM normal in all joints, no crepitus, no  deformity, Normal muscle strengthen. Back  is symmetric, no curvature. Skin: Skin color, texture, turgor normal. No rashes or lesions  Lymph nodes: Cervical, supraclavicular, and axillary nodes normal.  Neurologic: CN 2 -12 Normal, Normal symmetric reflexes. Normal coordination and gait  Psych: Alert & Oriented x 3, Mood appear stable.    Assessment:    Annual wellness medicare exam   Plan:    During the course of the visit the patient was educated and counseled about appropriate screening and preventive services including:   Annual mammogram  Annual flu vaccine  Tetanus immunization is up-to-date  Recommend COVID booster in the Fall  Past due for colonoscopy-needs to contact [facility]

## 2020-09-02 NOTE — Patient Instructions (Addendum)
It was a pleasure to see you today. Please schedule follow up colonoscopy. Watch diet and get more exercise. RTC in 3 months. Consider [facility] for weight loss. Have Covid booster and flu vaccine.

## 2020-09-25 NOTE — Patient Instructions (Addendum)
Take Zithromax Z-PAK 2 tablets day 1 followed by 1 tablet days 2 through 5.  Call if not better in 24 to 48 hours or sooner if worse.  COVID test obtained.  Use Cortisporin otic suspension 4 drops in left ear canal 4 times a day for 4-5 days.

## 2020-10-04 ENCOUNTER — Telehealth: Payer: Self-pay | Admitting: Internal Medicine

## 2020-10-04 NOTE — Telephone Encounter (Signed)
Called patient to let her know that she needs to call DR Ardis Hughs office and schedule colonoscopy that they had mailed her a letter in March to remind her. She stated that she would call them.

## 2020-10-23 ENCOUNTER — Other Ambulatory Visit: Payer: Self-pay | Admitting: Internal Medicine

## 2020-10-23 ENCOUNTER — Ambulatory Visit
Admission: RE | Admit: 2020-10-23 | Discharge: 2020-10-23 | Disposition: A | Discharge status: Home / Self Care | Payer: Medicare Other | Source: Ambulatory Visit | Attending: Internal Medicine | Admitting: Internal Medicine

## 2020-10-23 DIAGNOSIS — Z1231 Encounter for screening mammogram for malignant neoplasm of breast: Secondary | ICD-10-CM

## 2020-10-27 ENCOUNTER — Other Ambulatory Visit: Payer: Self-pay

## 2020-10-27 ENCOUNTER — Ambulatory Visit (INDEPENDENT_AMBULATORY_CARE_PROVIDER_SITE_OTHER): Payer: Medicare Other | Admitting: Internal Medicine

## 2020-10-27 DIAGNOSIS — Z23 Encounter for immunization: Secondary | ICD-10-CM

## 2020-10-27 NOTE — Progress Notes (Signed)
Flu shot given in left arm

## 2020-12-06 ENCOUNTER — Ambulatory Visit: Payer: Medicare Other | Admitting: Internal Medicine

## 2020-12-14 ENCOUNTER — Other Ambulatory Visit: Payer: Medicare Other | Admitting: Internal Medicine

## 2020-12-16 ENCOUNTER — Ambulatory Visit: Payer: Medicare Other | Admitting: Internal Medicine

## 2020-12-30 ENCOUNTER — Other Ambulatory Visit: Payer: Self-pay | Admitting: Internal Medicine

## 2021-01-13 ENCOUNTER — Other Ambulatory Visit: Payer: Self-pay | Admitting: Internal Medicine

## 2021-01-31 ENCOUNTER — Other Ambulatory Visit: Payer: Medicare Other | Admitting: Internal Medicine

## 2021-02-01 ENCOUNTER — Other Ambulatory Visit: Payer: Medicare Other | Admitting: Internal Medicine

## 2021-02-01 ENCOUNTER — Other Ambulatory Visit: Payer: Self-pay

## 2021-02-01 DIAGNOSIS — E7800 Pure hypercholesterolemia, unspecified: Secondary | ICD-10-CM

## 2021-02-01 DIAGNOSIS — R7302 Impaired glucose tolerance (oral): Secondary | ICD-10-CM | POA: Diagnosis not present

## 2021-02-01 NOTE — Progress Notes (Signed)
Lab only 

## 2021-02-02 LAB — HEPATIC FUNCTION PANEL
AG Ratio: 1.6 (calc) (ref 1.0–2.5)
ALT: 14 U/L (ref 6–29)
AST: 16 U/L (ref 10–35)
Albumin: 4.5 g/dL (ref 3.6–5.1)
Alkaline phosphatase (APISO): 59 U/L (ref 37–153)
Bilirubin, Direct: 0.1 mg/dL (ref 0.0–0.2)
Globulin: 2.8 g/dL (calc) (ref 1.9–3.7)
Indirect Bilirubin: 0.3 mg/dL (calc) (ref 0.2–1.2)
Total Bilirubin: 0.4 mg/dL (ref 0.2–1.2)
Total Protein: 7.3 g/dL (ref 6.1–8.1)

## 2021-02-02 LAB — HEMOGLOBIN A1C
Hgb A1c MFr Bld: 5.8 % of total Hgb — ABNORMAL HIGH (ref <5.7)
Mean Plasma Glucose: 120 mg/dL
eAG (mmol/L): 6.6 mmol/L

## 2021-02-02 LAB — LIPID PANEL
Cholesterol: 218 mg/dL — ABNORMAL HIGH (ref <200)
HDL: 54 mg/dL (ref >=50)
LDL Cholesterol (Calc): 133 mg/dL (calc) — ABNORMAL HIGH
Non-HDL Cholesterol (Calc): 164 mg/dL (calc) — ABNORMAL HIGH (ref <130)
Total CHOL/HDL Ratio: 4 (calc) (ref <5.0)
Triglycerides: 178 mg/dL — ABNORMAL HIGH (ref <150)

## 2021-02-03 ENCOUNTER — Encounter: Payer: Self-pay | Admitting: Internal Medicine

## 2021-02-03 ENCOUNTER — Ambulatory Visit (INDEPENDENT_AMBULATORY_CARE_PROVIDER_SITE_OTHER): Payer: Medicare Other | Admitting: Internal Medicine

## 2021-02-03 ENCOUNTER — Other Ambulatory Visit: Payer: Self-pay

## 2021-02-03 VITALS — BP 118/68 | HR 81 | Temp 97.9°F | Ht 66.0 in | Wt 203.0 lb

## 2021-02-03 DIAGNOSIS — Z8659 Personal history of other mental and behavioral disorders: Secondary | ICD-10-CM

## 2021-02-03 DIAGNOSIS — R7302 Impaired glucose tolerance (oral): Secondary | ICD-10-CM

## 2021-02-03 DIAGNOSIS — F439 Reaction to severe stress, unspecified: Secondary | ICD-10-CM

## 2021-02-03 DIAGNOSIS — E7800 Pure hypercholesterolemia, unspecified: Secondary | ICD-10-CM

## 2021-02-03 MED ORDER — ROSUVASTATIN CALCIUM 10 MG PO TABS: 10.0000 mg | ORAL_TABLET | Freq: Every day | ORAL | 3 refills | Status: DC

## 2021-02-03 NOTE — Progress Notes (Signed)
° °  Subjective:    Patient ID: Anita Wagner, female    DOB: [birth year], 66 y.o.   MRN: [id number]  HPI 66 year old Female seen for follow up.  She was here in August for health maintenance exam and Medicare wellness visit.  She has a history of impaired glucose tolerance.  In August hemoglobin A1c was 5.7% and is now 5.8%.  With regard to total cholesterol it was 202 in August and is now 218.  Triglycerides have increased from 167 in August to 178.  LDL cholesterol has increased from 121 in August to 133.  There has been some situational stress.  Her dog is ill and her husband was hospitalized with a gastrointestinal bleed but has recovered.  We have talked about medication for treatment of hyperlipidemia.  She has consented.  Also think she should have a coronary calcium score and that has been ordered.  I have convinced her to try Crestor 10 mg daily and we will follow-up in March with fasting lipid panel and liver functions.    Review of Systems no new complaints just some situational stress     Objective:   Physical Exam  Blood pressure 118/68 pulse 81 regular temperature 97.9 degrees pulse oximetry 97% weight 203  pounds. BMI 32.77. Has gained 3 pounds since August.  Diet and exercise options discussed.  Hemoglobin A1c is currently 5.8% and was 5.7% in August.  Lipid panel reviewed and total cholesterol has increased from 202 to 218.  LDL has increased from 167 to 178.  LDL has increased from 121-133.  I have persuaded her to try Crestor 10 mg daily and have a coronary calcium score.  Neck is supple without JVD thyromegaly or carotid bruits.  Chest is clear.  Cardiac exam: Regular rate and rhythm without ectopy.    Assessment & Plan:  Mixed hyperlipidemia-start Crestor 10 mg daily.  Have coronary calcium score.  Follow-up in March.  Encourage diet and exercise.  Impaired glucose tolerance-hemoglobin A1c 5.8% with diet alone.  Situational stress discussed.  Patient has  Klonopin for chronic anxiety.  GE reflux treated with Dexilant.  Plan: Follow-up in March.  Work on diet exercise and weight loss and take Crestor 10 mg daily.  Have coronary calcium score. Marland Kitchen

## 2021-02-03 NOTE — Patient Instructions (Addendum)
Start Crestor 10 mg daily.  Have coronary calcium score.  Follow-up in March.  Encourage diet and exercise.

## 2021-02-09 ENCOUNTER — Other Ambulatory Visit: Payer: Self-pay | Admitting: Internal Medicine

## 2021-02-16 ENCOUNTER — Other Ambulatory Visit: Payer: Self-pay

## 2021-02-16 ENCOUNTER — Ambulatory Visit: Payer: Medicare Other | Admitting: Physician Assistant

## 2021-02-16 ENCOUNTER — Encounter: Payer: Self-pay | Admitting: Physician Assistant

## 2021-02-16 DIAGNOSIS — Z1283 Encounter for screening for malignant neoplasm of skin: Secondary | ICD-10-CM

## 2021-02-16 DIAGNOSIS — D485 Neoplasm of uncertain behavior of skin: Secondary | ICD-10-CM

## 2021-02-16 DIAGNOSIS — L820 Inflamed seborrheic keratosis: Secondary | ICD-10-CM

## 2021-02-16 DIAGNOSIS — D224 Melanocytic nevi of scalp and neck: Secondary | ICD-10-CM

## 2021-02-16 DIAGNOSIS — L821 Other seborrheic keratosis: Secondary | ICD-10-CM | POA: Diagnosis not present

## 2021-02-16 NOTE — Patient Instructions (Signed)

## 2021-02-22 ENCOUNTER — Telehealth: Payer: Self-pay | Admitting: Physician Assistant

## 2021-02-22 NOTE — Telephone Encounter (Signed)
Patient calling for results. Informed we are waiting for provider to review results.

## 2021-02-23 NOTE — Telephone Encounter (Signed)
Pathology to patient- told 6 month recheck unless right front neck re-pigments then [facility] recommends wider shave. Patient understood.

## 2021-03-02 ENCOUNTER — Other Ambulatory Visit: Payer: Self-pay

## 2021-03-02 ENCOUNTER — Ambulatory Visit (HOSPITAL_COMMUNITY)
Admission: RE | Admit: 2021-03-02 | Discharge: 2021-03-02 | Disposition: A | Discharge status: Home / Self Care | Payer: Medicare Other | Source: Ambulatory Visit | Attending: Internal Medicine | Admitting: Internal Medicine

## 2021-03-02 ENCOUNTER — Encounter (HOSPITAL_COMMUNITY): Payer: Self-pay

## 2021-03-02 DIAGNOSIS — I700 Atherosclerosis of aorta: Secondary | ICD-10-CM | POA: Insufficient documentation

## 2021-03-02 DIAGNOSIS — Z136 Encounter for screening for cardiovascular disorders: Secondary | ICD-10-CM | POA: Insufficient documentation

## 2021-03-02 DIAGNOSIS — K760 Fatty (change of) liver, not elsewhere classified: Secondary | ICD-10-CM | POA: Insufficient documentation

## 2021-03-02 DIAGNOSIS — J439 Emphysema, unspecified: Secondary | ICD-10-CM | POA: Insufficient documentation

## 2021-03-02 DIAGNOSIS — R911 Solitary pulmonary nodule: Secondary | ICD-10-CM | POA: Insufficient documentation

## 2021-03-08 ENCOUNTER — Ambulatory Visit (INDEPENDENT_AMBULATORY_CARE_PROVIDER_SITE_OTHER): Payer: Medicare Other | Admitting: Internal Medicine

## 2021-03-08 ENCOUNTER — Other Ambulatory Visit: Payer: Self-pay

## 2021-03-08 ENCOUNTER — Encounter: Payer: Self-pay | Admitting: Internal Medicine

## 2021-03-08 VITALS — BP 108/68 | HR 75 | Temp 98.3°F | Ht 66.0 in | Wt 203.2 lb

## 2021-03-08 DIAGNOSIS — E7800 Pure hypercholesterolemia, unspecified: Secondary | ICD-10-CM | POA: Diagnosis not present

## 2021-03-08 DIAGNOSIS — R911 Solitary pulmonary nodule: Secondary | ICD-10-CM | POA: Diagnosis not present

## 2021-03-08 DIAGNOSIS — J432 Centrilobular emphysema: Secondary | ICD-10-CM | POA: Diagnosis not present

## 2021-03-08 DIAGNOSIS — Z87891 Personal history of nicotine dependence: Secondary | ICD-10-CM

## 2021-03-08 DIAGNOSIS — Z6832 Body mass index (BMI) 32.0-32.9, adult: Secondary | ICD-10-CM

## 2021-03-08 DIAGNOSIS — Z8659 Personal history of other mental and behavioral disorders: Secondary | ICD-10-CM

## 2021-03-08 NOTE — Progress Notes (Signed)
° °  Subjective:    Patient ID: Anita Wagner, female    DOB: 27-May-1955, 66 y.o.   MRN: [id number]  HPI 66 year Female smoker with hyperlipidemia seen for follow up.  She recently had CT cardiac scoring.  She is a smoker.  Family history of heart failure in her mother.  Coronary calcium score was 0 in all vessels including left main, left anterior descending artery, left circumflex artery and right coronary artery.  Ascending aorta was normal caliber.  She did have fasting lipid panel in January 2023.  Total cholesterol had increased from 202 in August to 218.  Triglycerides had increased from 167 in August to 178.  LDL had increased from 1 21-1 33.  She is on Crestor generic 10 mg daily.  She did not want to increase dose of statin.  Is smoking about 10 cigarettes/day.  Once again discussion about smoking cessation.  She realizes she needs to quit.  Has some questions about over read the CT of chest.  She was found to have some aortic atherosclerosis and should continue with statin therapy.  There was no thoracic adenopathy.  There was mild centrilobular emphysema which I reminded her was due to smoking.  There was a subpleural 4 mm right lower lobe pulmonary nodule.  She also had mild hepatic steatosis which explained to her was basically fatty liver.  She certainly should be on a statin.  Regarding the 4 mm right lower lobe pulmonary nodule she can have another CT in 12 months.  I spoke with her about being referred to pulmonary for lung cancer surveillance.  She will consider it.  We can discuss this again at her health maintenance exam.    Review of Systems no new complaints     Objective:   Physical Exam Blood pressure 108/68 pulse 75 temperature 98.3 degrees pulse oximetry 97% weight 203 pounds 4 ounces BMI 32.81.  Would like to see BMI less than 30.  Needs to continue with diet and exercise regimen.  Patient was not examined today but discussion held regarding lung findings and coronary  calcium score.       Assessment & Plan:   Centrilobular emphysema-due to smoking.  Patient needs to quit smoking.  4 mm pulmonary nodule.  No evidence of lung cancer.  We can set her up with lung cancer screening at [facility] for next year if she so desires  Pure hypercholesterolemia-continue statin therapy  Coronary calcium score is 0 which is excellent  Plan: She is returning in late March for already planned appointment for hyperlipidemia.  She should try very hard with diet and exercise regimen.  BMI is 32.  Time spent with patient today is 30 minutes in discussion of these findings and recommendations given

## 2021-03-08 NOTE — Patient Instructions (Signed)
Please stop smoking.  Please work harder with diet and exercise regimen.  Continue statin medication.  You can be referred for lung cancer screening at [facility] since you have a 4 mm pulmonary nodule and have history of smoking.  Centrilobular emphysema is present.  You must quit smoking.  BMI is elevated at 32.

## 2021-03-13 ENCOUNTER — Encounter: Payer: Self-pay | Admitting: Physician Assistant

## 2021-03-13 NOTE — Progress Notes (Signed)
° °  New Patient   Subjective  Anita Wagner is a 66 y.o. female who presents for the following: Annual Exam (Around neck x years- one on right neck itch's sometimes, right cheek- just wants checked. No family or personal history of melanoma or non mole skin cancers. ).   The following portions of the chart were reviewed this encounter and updated as appropriate:  Tobacco   Allergies   Meds   Problems   Med Hx   Surg Hx   Fam Hx       Objective  Well appearing patient in no apparent distress; mood and affect are within normal limits.  A full examination was performed including scalp, head, eyes, ears, nose, lips, neck, chest, axillae, abdomen, back, buttocks, bilateral upper extremities, bilateral lower extremities, hands, feet, fingers, toes, fingernails, and toenails. All findings within normal limits unless otherwise noted below.  Right Flank Crusted plaque on an erythematous base.  Right front neck Bichromic dark nested macule.      right side neck Bichromic dark nested macule.       Assessment & Plan  Inflamed seborrheic keratosis Right Flank  Destruction of lesion - Right Flank Complexity: simple   Destruction method: cryotherapy   Informed consent: discussed and consent obtained   Timeout:  patient name, date of birth, surgical site, and procedure verified Lesion destroyed using liquid nitrogen: Yes   Cryotherapy cycles:  3 Outcome: patient tolerated procedure well with no complications   Post-procedure details: wound care instructions given    Neoplasm of uncertain behavior of skin (2) Right front neck  Skin / nail biopsy Type of biopsy: tangential   Informed consent: discussed and consent obtained   Timeout: patient name, date of birth, surgical site, and procedure verified   Procedure prep:  Patient was prepped and draped in usual sterile fashion (Non sterile) Prep type:  Chlorhexidine Anesthesia: the lesion was anesthetized in a standard fashion    Anesthetic:  1% lidocaine w/ epinephrine 1-100,000 local infiltration Instrument used: flexible razor blade   Outcome: patient tolerated procedure well   Post-procedure details: wound care instructions given    Specimen 1 - Surgical pathology Differential Diagnosis: atypia  Check Margins: yes  right side neck  Skin / nail biopsy Type of biopsy: tangential   Informed consent: discussed and consent obtained   Timeout: patient name, date of birth, surgical site, and procedure verified   Procedure prep:  Patient was prepped and draped in usual sterile fashion (Non sterile) Prep type:  Chlorhexidine Anesthesia: the lesion was anesthetized in a standard fashion   Anesthetic:  1% lidocaine w/ epinephrine 1-100,000 local infiltration Instrument used: flexible razor blade   Outcome: patient tolerated procedure well   Post-procedure details: wound care instructions given    Specimen 2 - Surgical pathology Differential Diagnosis: isk  Check Margins: No     I, Braun Rocca, PA-C, have reviewed all documentation's for this visit.  The documentation on 03/13/21 for the exam, diagnosis, procedures and orders are all accurate and complete.

## 2021-04-05 ENCOUNTER — Telehealth: Payer: Self-pay

## 2021-04-05 ENCOUNTER — Other Ambulatory Visit: Payer: Medicare Other

## 2021-04-05 DIAGNOSIS — E7800 Pure hypercholesterolemia, unspecified: Secondary | ICD-10-CM

## 2021-04-05 NOTE — Telephone Encounter (Signed)
Patient cancelled her appt for today. She states that she has a headache, body joints ache and diarrhea since yesterday. She took a covid test and it was negative and she asked if it could be the statin she has been on. She read it can tae a few weeks for symptoms to start. She asked if she should stop the medication. I told her that was her decision but I didn't think it was the medication after 6 weeks. She has decided not to stop it but has cancelled her appts for this week and rescheduled for next week. If she doesn't improve she will call for appointment.  ?

## 2021-04-08 ENCOUNTER — Ambulatory Visit: Payer: Medicare Other | Admitting: Internal Medicine

## 2021-04-12 ENCOUNTER — Other Ambulatory Visit: Payer: Medicare Other

## 2021-04-12 DIAGNOSIS — E7800 Pure hypercholesterolemia, unspecified: Secondary | ICD-10-CM | POA: Diagnosis not present

## 2021-04-13 LAB — LIPID PANEL
Cholesterol: 176 mg/dL (ref <200)
HDL: 61 mg/dL (ref >=50)
LDL Cholesterol (Calc): 94 mg/dL (calc)
Non-HDL Cholesterol (Calc): 115 mg/dL (calc) (ref <130)
Total CHOL/HDL Ratio: 2.9 (calc) (ref <5.0)
Triglycerides: 117 mg/dL (ref <150)

## 2021-04-13 LAB — HEPATIC FUNCTION PANEL
AG Ratio: 1.4 (calc) (ref 1.0–2.5)
ALT: 14 U/L (ref 6–29)
AST: 19 U/L (ref 10–35)
Albumin: 4.2 g/dL (ref 3.6–5.1)
Alkaline phosphatase (APISO): 59 U/L (ref 37–153)
Bilirubin, Direct: 0.1 mg/dL (ref 0.0–0.2)
Globulin: 3 g/dL (calc) (ref 1.9–3.7)
Indirect Bilirubin: 0.4 mg/dL (calc) (ref 0.2–1.2)
Total Bilirubin: 0.5 mg/dL (ref 0.2–1.2)
Total Protein: 7.2 g/dL (ref 6.1–8.1)

## 2021-04-14 ENCOUNTER — Encounter: Payer: Self-pay | Admitting: Internal Medicine

## 2021-04-14 ENCOUNTER — Ambulatory Visit (INDEPENDENT_AMBULATORY_CARE_PROVIDER_SITE_OTHER): Payer: Medicare Other | Admitting: Internal Medicine

## 2021-04-14 VITALS — BP 122/64 | HR 80 | Temp 98.4°F | Ht 66.0 in | Wt 204.0 lb

## 2021-04-14 DIAGNOSIS — E782 Mixed hyperlipidemia: Secondary | ICD-10-CM | POA: Diagnosis not present

## 2021-04-14 DIAGNOSIS — Z87891 Personal history of nicotine dependence: Secondary | ICD-10-CM | POA: Diagnosis not present

## 2021-04-14 DIAGNOSIS — R7302 Impaired glucose tolerance (oral): Secondary | ICD-10-CM | POA: Diagnosis not present

## 2021-04-14 DIAGNOSIS — Z8659 Personal history of other mental and behavioral disorders: Secondary | ICD-10-CM

## 2021-04-14 DIAGNOSIS — Z6832 Body mass index (BMI) 32.0-32.9, adult: Secondary | ICD-10-CM | POA: Diagnosis not present

## 2021-04-22 ENCOUNTER — Telehealth: Payer: Self-pay | Admitting: Internal Medicine

## 2021-04-22 MED ORDER — SIMVASTATIN 20 MG PO TABS: 20.0000 mg | ORAL_TABLET | Freq: Every day | ORAL | 3 refills | Status: DC

## 2021-04-22 NOTE — Telephone Encounter (Signed)
Pt said that when she was in here on 04/14/21 Dr Renold Genta mentioned sending in Zocor to replace rosuvastatin because it was hurting joints. She said the pharmacy hasnt got anything yet ?

## 2021-04-27 DIAGNOSIS — H5203 Hypermetropia, bilateral: Secondary | ICD-10-CM | POA: Diagnosis not present

## 2021-04-27 DIAGNOSIS — H2513 Age-related nuclear cataract, bilateral: Secondary | ICD-10-CM | POA: Diagnosis not present

## 2021-05-01 NOTE — Progress Notes (Signed)
   Subjective:    Patient ID: Anita Wagner, female    DOB: 1955-03-23, 66 y.o.   MRN: [id number]  HPI 66 year old Female in today for follow-up on hyperlipidemia.  Liver functions are normal on statin medication.  Lipids are now normal.  Total cholesterol 176, HDL cholesterol 61, triglycerides 117 and LDL cholesterol 94.  In January total cholesterol was 218, triglycerides 178 and LDL cholesterol 133.  She had CT coronary scoring in February 2023.  Her score was in excellent at 0.   Patient had previously been on Crestor 10 mg daily.  She wants to change to simvastatin.  However I think a slightly higher dose of statin medication is worthwhile.  She has a history of smoking and a family history of congestive heart failure in her mother.  Changing to simvastatin 20 mg daily.  Follow-up with lipid panel liver functions and office visit in June.  She has impaired glucose tolerance with hemoglobin A1c in January 5.8%.  Review of Systems see above-no new complaints -    Objective:   Physical Exam Blood pressure excellent at 122/64 pulse 80 temperature 98.4 degrees pulse oximetry 95% weight 204 pounds BMI 32.93 neck is supple.  No thyromegaly or carotid bruits.  Chest is clear to auscultation.  Cardiac exam: Regular rate and rhythm.  No pitting edema of the lower extremities.       Assessment & Plan:  History of smoking-needs to quit  Hyperlipidemia-patient wants to change from Crestor to simvastatin.  Prescribing simvastatin 20 mg daily and follow-up with lipid panel and liver functions in June.  Coronary calcium screening was excellent with a score of 0  BMI 32.93-needs to get serious about diet exercise and weight loss.  Impaired glucose tolerance-does not want to be on metformin.  Needs to really buckle down with diet exercise and weight loss.  Plan: Follow-up in June.  Plan: Follow-up in June as outlined above.

## 2021-05-23 ENCOUNTER — Telehealth: Payer: Self-pay | Admitting: Physician Assistant

## 2021-05-23 MED ORDER — TRETINOIN 0.05 % EX CREA: TOPICAL_CREAM | Freq: Every day | CUTANEOUS | 5 refills | Status: AC

## 2021-05-23 NOTE — Telephone Encounter (Signed)
Verbal order per kelli sheffield- okay to give tretinoin 0.05 % cream - sent to patient pharmacy.  ?

## 2021-05-23 NOTE — Telephone Encounter (Signed)
Patient wants retin-a script sent to [facility] on [address]. ?

## 2021-06-06 NOTE — Patient Instructions (Signed)
At patient request changing from Crestor to simvastatin.  Needs to quit smoking.  Coronary calcium scoring was excellent at 0.  BMI is 32.93 and patient needs to be serious about diet exercise and weight loss.  She has impaired glucose tolerance but does not want to be on medication.  Follow-up.  In June.

## 2021-06-09 ENCOUNTER — Other Ambulatory Visit: Payer: Medicare Other

## 2021-06-09 DIAGNOSIS — E782 Mixed hyperlipidemia: Secondary | ICD-10-CM | POA: Diagnosis not present

## 2021-06-10 LAB — HEPATIC FUNCTION PANEL
AG Ratio: 1.5 (calc) (ref 1.0–2.5)
ALT: 11 U/L (ref 6–29)
AST: 16 U/L (ref 10–35)
Albumin: 4.4 g/dL (ref 3.6–5.1)
Alkaline phosphatase (APISO): 67 U/L (ref 37–153)
Bilirubin, Direct: 0.1 mg/dL (ref 0.0–0.2)
Globulin: 2.9 g/dL (calc) (ref 1.9–3.7)
Indirect Bilirubin: 0.4 mg/dL (calc) (ref 0.2–1.2)
Total Bilirubin: 0.5 mg/dL (ref 0.2–1.2)
Total Protein: 7.3 g/dL (ref 6.1–8.1)

## 2021-06-10 LAB — LIPID PANEL
Cholesterol: 190 mg/dL (ref <200)
HDL: 57 mg/dL (ref >=50)
LDL Cholesterol (Calc): 107 mg/dL (calc) — ABNORMAL HIGH
Non-HDL Cholesterol (Calc): 133 mg/dL (calc) — ABNORMAL HIGH (ref <130)
Total CHOL/HDL Ratio: 3.3 (calc) (ref <5.0)
Triglycerides: 144 mg/dL (ref <150)

## 2021-06-13 ENCOUNTER — Encounter: Payer: Self-pay | Admitting: Internal Medicine

## 2021-06-13 ENCOUNTER — Ambulatory Visit (INDEPENDENT_AMBULATORY_CARE_PROVIDER_SITE_OTHER): Payer: Medicare Other | Admitting: Internal Medicine

## 2021-06-13 ENCOUNTER — Other Ambulatory Visit: Payer: Self-pay | Admitting: Internal Medicine

## 2021-06-13 VITALS — BP 112/64 | HR 75 | Temp 97.7°F | Ht 66.0 in | Wt 203.5 lb

## 2021-06-13 DIAGNOSIS — E782 Mixed hyperlipidemia: Secondary | ICD-10-CM | POA: Diagnosis not present

## 2021-06-13 DIAGNOSIS — M19042 Primary osteoarthritis, left hand: Secondary | ICD-10-CM | POA: Diagnosis not present

## 2021-06-13 DIAGNOSIS — Z8659 Personal history of other mental and behavioral disorders: Secondary | ICD-10-CM

## 2021-06-13 DIAGNOSIS — Z87891 Personal history of nicotine dependence: Secondary | ICD-10-CM

## 2021-06-13 DIAGNOSIS — M19041 Primary osteoarthritis, right hand: Secondary | ICD-10-CM

## 2021-06-13 MED ORDER — MELOXICAM 15 MG PO TABS: 15.0000 mg | ORAL_TABLET | Freq: Every day | ORAL | 1 refills | Status: DC

## 2021-06-13 MED ORDER — SIMVASTATIN 10 MG PO TABS: ORAL_TABLET | ORAL | 1 refills | Status: DC

## 2021-06-13 NOTE — Patient Instructions (Addendum)
She will take simvastatin 10 mg 3 times a week and have Medicare wellness visit in August.  For musculoskeletal pain, she will take meloxicam 15 mg daily with a meal sparingly particularly with doing yard work.  Please stop smoking.  Other than taking meloxicam, not a lot to do for hand arthritis.  Okay to refill Klonopin if pharmacy calls.  Vaccines discussed and can be obtained at pharmacy.

## 2021-06-13 NOTE — Progress Notes (Signed)
   Subjective:    Patient ID: Anita Wagner, female    DOB: [birth year], 66 y.o.   MRN: [id number]  HPI 66 year old Female seen for follow up on hyperlipidemia.  At last visit in April, her lipids were excellent but she wanted to change from Crestor to simvastatin.  We made the change and she is here for follow-up.  She had CT calcium scoring in February 2023 and her score was excellent at 0.  She continues to smoke.  She was changed to simvastatin 20 mg daily in April and had been on Crestor 10 mg daily.  She felt the simvastatin was causing some joint pains so she began to take half of a 20 mg tablet 3 times a week.  She is now here for follow-up.  Basically her lipids on simvastatin are normal with the exception of an LDL slightly elevated at 107 and previously was 94 on Crestor.  Total cholesterol has increased from 176 to 190.  HDL has decreased slightly from 61 to 57 and triglycerides are slightly up from 117 to 144  Is due Tdap and she should get this at the pharmacy since she is on Medicare.  She should consider COVID-vaccine in the fall.  Spoke with her about Shingrix vaccine need.  That can be obtained at pharmacy as well.  Review of Systems due for colonoscopy- she will call for appt.  Anita Wagner makes her hands hurt.     Objective:   Physical Exam   BP 112/64, pulse 75 Labs reviewed with her.  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  Try meloxicam 15 mg sparingly as needed for hand pain.  Mixed hyperlipidemia-currently on simvastatin 10 mg (one half of a 20 mg tablet) she chose to decrease dose due to joint pain.  She will get new prescription for simvastatin 10 mg and take this 3 times weekly.  LDL is slightly elevated at 107.  Continue diet and exercise efforts.  Anxiety-continue with Klonopin  GE reflux treated with Dexilant  Plan: Her Medicare wellness  visit is now due and we have set this up for August.  Vaccines to be obtained at pharmacy as discussed above.  Takes simvastatin 10 mg 3 times a week for mixed hyperlipidemia.

## 2021-07-10 ENCOUNTER — Other Ambulatory Visit: Payer: Self-pay | Admitting: Internal Medicine

## 2021-07-14 ENCOUNTER — Telehealth: Payer: Self-pay | Admitting: Internal Medicine

## 2021-07-14 ENCOUNTER — Ambulatory Visit (INDEPENDENT_AMBULATORY_CARE_PROVIDER_SITE_OTHER): Payer: Medicare Other | Admitting: Internal Medicine

## 2021-07-14 ENCOUNTER — Ambulatory Visit (HOSPITAL_COMMUNITY)
Admission: RE | Admit: 2021-07-14 | Discharge: 2021-07-14 | Disposition: A | Discharge status: Home / Self Care | Payer: Medicare Other | Source: Ambulatory Visit | Attending: Internal Medicine | Admitting: Internal Medicine

## 2021-07-14 ENCOUNTER — Encounter: Payer: Self-pay | Admitting: Internal Medicine

## 2021-07-14 VITALS — BP 122/68 | HR 65 | Temp 98.4°F

## 2021-07-14 DIAGNOSIS — M25471 Effusion, right ankle: Secondary | ICD-10-CM | POA: Diagnosis not present

## 2021-07-14 DIAGNOSIS — Z86718 Personal history of other venous thrombosis and embolism: Secondary | ICD-10-CM | POA: Insufficient documentation

## 2021-07-14 NOTE — Progress Notes (Signed)
   Subjective:    Patient ID: Anita Wagner, female    DOB: 1955-11-06, 66 y.o.   MRN: [id number]  HPI  Some 3 or 4 days ago, patient was in her yard doing some yard work and then noticed thereafter some swelling of right ankle which has persisted. She is concerned about this.  Past medical history: Remote history of DVT in December 2015 related to taking a long car trip, estrogen replacement and smoking.  Was treated with anticoagulation and has had no recurrences.  History of sensorineural hearing loss, eosinophilia, allergic rhinitis, musculoskeletal pain, GE reflux and anxiety.  Social history:  Has smoked for well over 20 years.  1 glass of wine daily.  Married.  No children.    Review of Systems history of displaced fracture fifth metatarsal right foot in 2022 seen by podiatrist.  No dyspnea or chest pain.     Objective:   Physical Exam Vital signs reviewed and are stable.  Pulse oximetry 98%.  She has some mild swelling right lateral ankle.  Bevelyn Buckles' sign is negative.  No erythema of the ankle or increased warmth.       Assessment & Plan:    Suspect this is a mild ankle sprain however have ordered Doppler study at [facility] at 2:30 PM with prior history of DVT  History of anxiety  Plan: Await results of Doppler study this afternoon.  We will contact patient with results.

## 2021-07-14 NOTE — Patient Instructions (Signed)
Pt to have Doppler and [facility] at @ 2:30 pm today.

## 2021-07-14 NOTE — Telephone Encounter (Signed)
Anita Wagner [id number]  [name] called to say for the last 3 days her right ankle has been swollen on the inside and she was concerned because this was the one she had the DVT in before.

## 2021-07-14 NOTE — Progress Notes (Signed)
Right lower extremity venous duplex has been completed. Preliminary results can be found in CV Proc through chart review.  Results were given to Dr. Renold Genta.  07/14/21 2:44 PM Anita Wagner RVT

## 2021-08-10 DIAGNOSIS — S82891A Other fracture of right lower leg, initial encounter for closed fracture: Secondary | ICD-10-CM | POA: Diagnosis not present

## 2021-08-29 DIAGNOSIS — M2011 Hallux valgus (acquired), right foot: Secondary | ICD-10-CM | POA: Diagnosis not present

## 2021-09-05 ENCOUNTER — Other Ambulatory Visit: Payer: Medicare Other

## 2021-09-06 ENCOUNTER — Ambulatory Visit: Payer: Medicare Other | Admitting: Internal Medicine

## 2021-09-20 DIAGNOSIS — E612 Magnesium deficiency: Secondary | ICD-10-CM | POA: Diagnosis not present

## 2021-09-20 DIAGNOSIS — E785 Hyperlipidemia, unspecified: Secondary | ICD-10-CM | POA: Diagnosis not present

## 2021-09-20 DIAGNOSIS — Z0001 Encounter for general adult medical examination with abnormal findings: Secondary | ICD-10-CM | POA: Diagnosis not present

## 2021-09-20 DIAGNOSIS — I10 Essential (primary) hypertension: Secondary | ICD-10-CM | POA: Diagnosis not present

## 2021-11-15 ENCOUNTER — Other Ambulatory Visit: Payer: Self-pay | Admitting: Internal Medicine

## 2021-11-24 ENCOUNTER — Telehealth: Payer: Self-pay | Admitting: Internal Medicine

## 2021-11-24 NOTE — Telephone Encounter (Signed)
LVM to CB to schedule appointments

## 2021-12-07 ENCOUNTER — Ambulatory Visit (INDEPENDENT_AMBULATORY_CARE_PROVIDER_SITE_OTHER): Payer: Medicare Other

## 2021-12-07 VITALS — Wt 198.0 lb

## 2021-12-07 DIAGNOSIS — Z0000 Encounter for general adult medical examination without abnormal findings: Secondary | ICD-10-CM | POA: Diagnosis not present

## 2021-12-07 NOTE — Progress Notes (Signed)
Subjective:   Anita Wagner is a 66 y.o. female who presents for Medicare Annual (Subsequent) preventive examination.  Review of Systems           Objective:    There were no vitals filed for this visit. There is no height or weight on file to calculate BMI.     11/18/2014    9:08 AM  Advanced Directives  Does Patient Have a Medical Advance Directive? Yes  Type of Advance Directive Healthcare Power of Attorney    Current Medications (verified) Outpatient Encounter Medications as of 12/07/2021  Medication Sig   acetaminophen (TYLENOL) 325 MG tablet Take 650 mg by mouth every 6 (six) hours as needed.   albuterol (VENTOLIN HFA) 108 (90 Base) MCG/ACT inhaler TAKE 2 PUFFS BY MOUTH EVERY 6 HOURS AS NEEDED FOR WHEEZE OR SHORTNESS OF BREATH   aspirin 81 MG tablet Take 81 mg by mouth daily.   clonazePAM (KLONOPIN) 0.5 MG tablet TAKE 1 TABLET BY MOUTH TWICE A DAY AS NEEDED   dexlansoprazole (DEXILANT) 60 MG capsule TAKE 1 CAPSULE BY MOUTH EVERY DAY   Melatonin 5 MG TABS Take by mouth at bedtime.   meloxicam (MOBIC) 15 MG tablet Take 1 tablet (15 mg total) by mouth daily.   Multiple Vitamin (MULTIVITAMIN) capsule Take 1 capsule by mouth daily.   simvastatin (ZOCOR) 10 MG tablet One tab 3 times a week with supper   tretinoin (RETIN-A) 0.05 % cream Apply topically at bedtime. Apply to affected area qhs- CASH PAY   No facility-administered encounter medications on file as of 12/07/2021.    Allergies (verified) Amoxicillin-pot clavulanate, Sulfa antibiotics, Erythromycin, and Levofloxacin   History: Past Medical History:  Diagnosis Date   Anxiety    Diverticulitis    2016   DVT (deep venous thrombosis) (HCC)    calf- 2014   GERD (gastroesophageal reflux disease)    Seizures ([place])    at age 41- blood sugar issue; none since   Past Surgical History:  Procedure Laterality Date   ABDOMINAL HYSTERECTOMY     tah bso   ENDOMETRIAL ABLATION     TONSILLECTOMY     Family  History  Problem Relation Age of Onset   Cancer Father    Congestive Heart Failure Mother    Colon cancer Neg Hx    Esophageal cancer Neg Hx    Rectal cancer Neg Hx    Stomach cancer Neg Hx    Social History   Socioeconomic History   Marital status: Married    Spouse name: Not on file   Number of children: Not on file   Years of education: Not on file   Highest education level: Not on file  Occupational History   Not on file  Tobacco Use   Smoking status: Every Day    Packs/day: 1.00    Years: 40.00    Total pack years: 40.00    Types: Cigarettes   Smokeless tobacco: Never  Substance and Sexual Activity   Alcohol use: Yes    Alcohol/week: 7.0 standard drinks of alcohol    Types: 7 Standard drinks or equivalent per week    Comment: daily- 1-2 glasses   Drug use: No   Sexual activity: Not on file  Other Topics Concern   Not on file  Social History Narrative   Not on file   Social Determinants of Health   Financial Resource Strain: Not on file  Food Insecurity: Not on file  Transportation Needs: Not  on file  Physical Activity: Not on file  Stress: Not on file  Social Connections: Not on file    Tobacco Counseling Ready to quit: Not Answered Counseling given: Not Answered   Clinical Intake:                 Diabetic? No         Activities of Daily Living     No data to display           Patient Care Team: Baxley, Cresenciano Lick, MD as PCP - General (Internal Medicine) Warren Danes, PA-C as Physician Assistant (Dermatology)  Indicate any recent Medical Services you may have received from other than [facility] providers in the past year (date may be approximate).     Assessment:   This is a routine wellness examination for Anita Wagner.  Hearing/Vision screen No results found.  Dietary issues and exercise activities discussed:     Goals Addressed   None   Depression Screen    10/07/2021    9:20 AM 09/02/2020   11:27 AM 06/30/2019     3:23 PM 01/16/2017    3:13 PM 03/14/2013    3:47 PM  PHQ 2/9 Scores  PHQ - 2 Score 0 0 0 0 0    Fall Risk    09/02/2020   11:27 AM 06/30/2019    3:23 PM 03/14/2013    3:47 PM  [place] in the past year? 0 0 No  Number falls in past yr: 0 0   Injury with Fall? 0 0   Risk for fall due to : No Fall Risks    Follow up Falls evaluation completed Falls evaluation completed     [place]:  Any stairs in or around the home? Yes  If so, are there any without handrails? Yes  Home free of loose throw rugs in walkways, pet beds, electrical cords, etc? Yes  Adequate lighting in your home to reduce risk of falls? Yes   ASSISTIVE DEVICES UTILIZED TO PREVENT FALLS:  Life alert? No  Use of a cane, walker or w/c? No  Grab bars in the bathroom? Yes  Shower chair or bench in shower? No  Elevated toilet seat or a handicapped toilet? Yes   TIMED UP AND GO:   Cognitive Function:        Immunizations Immunization History  Administered Date(s) Administered   Influenza Inj Mdck Quad Pf 09/28/2018   Influenza Split 12/09/2010, 12/25/2011   Influenza,inj,Quad PF,6+ Mos 10/02/2012, 10/28/2013, 10/26/2014, 10/29/2015, 09/21/2016, 09/20/2017, 10/27/2020   Influenza-Unspecified 10/12/2019   PFIZER(Purple Top)SARS-COV-2 Vaccination 04/07/2019, 04/21/2019, 10/28/2019, 11/22/2020   Pneumococcal Conjugate-13 10/26/2014, 06/30/2019   Pneumococcal Polysaccharide-23 06/30/2013   Tdap 12/23/2001, 12/25/2011    TDAP status: Up to date  Flu Vaccine status: Up to date  Pneumococcal vaccine status: Due, Education has been provided regarding the importance of this vaccine. Advised may receive this vaccine at local pharmacy or Health Dept. Aware to provide a copy of the vaccination record if obtained from local pharmacy or Health Dept. Verbalized acceptance and understanding.  Covid-19 vaccine status: Completed vaccines  Qualifies for Shingles Vaccine? No    Zostavax completed No   Shingrix Completed?: No.    Education has been provided regarding the importance of this vaccine. Patient has been advised to call insurance company to determine out of pocket expense if they have not yet received this vaccine. Advised may also receive vaccine at local pharmacy  or Health Dept. Verbalized acceptance and understanding.  Screening Tests Health Maintenance  Topic Date Due   Hepatitis C Screening  Never done   Zoster Vaccines- Shingrix (1 of 2) Never done   Lung Cancer Screening  Never done   COLONOSCOPY (Pts 45-64yr Insurance coverage will need to be confirmed)  12/15/2019   Pneumonia Vaccine 67 Years old (3 - PPSV23 or PCV20) 06/29/2020   INFLUENZA VACCINE  08/09/2021   COVID-19 Vaccine (5 - 2023-24 season) 09/09/2021   MAMMOGRAM  10/23/2021   Medicare Annual Wellness (AWV)  12/08/2022   DEXA SCAN  Completed   HPV VACCINES  Aged Out    Health Maintenance  Health Maintenance Due  Topic Date Due   Hepatitis C Screening  Never done   Zoster Vaccines- Shingrix (1 of 2) Never done   Lung Cancer Screening  Never done   COLONOSCOPY (Pts 45-488yrInsurance coverage will need to be confirmed)  12/15/2019   Pneumonia Vaccine 6567Years old (3 - PPSV23 or PCV20) 06/29/2020   INFLUENZA VACCINE  08/09/2021   COVID-19 Vaccine (5 - 2023-24 season) 09/09/2021   MAMMOGRAM  10/23/2021    Colorectal cancer screening: Referral to GI placed N/A. Pt aware the office will call re: appt.  Mammogram status: Ordered TBD. Pt provided with contact info and advised to call to schedule appt.   Bone Density status: Completed 12/30/19. Results reflect: Bone density results: NORMAL. Repeat every N/A years.  Lung Cancer Screening: (Low Dose CT Chest recommended if Age 67-80ears, 30 pack-year currently smoking OR have quit w/in 15years.) does qualify.   Lung Cancer Screening Referral:   Additional Screening:  Hepatitis C Screening: does not qualify;   Vision  Screening: Recommended annual ophthalmology exams for early detection of glaucoma and other disorders of the eye. Is the patient up to date with their annual eye exam?  Yes  Who is the provider or what is the name of the office in which the patient attends annual eye exams? N/A If pt is not established with a provider, would they like to be referred to a provider to establish care? No .   Dental Screening: Recommended annual dental exams for proper oral hygiene  Community Resource Referral / Chronic Care Management: CRR required this visit?  No   CCM required this visit?  No      Plan:     I have personally reviewed and noted the following in the patient's chart:   Medical and social history Use of alcohol, tobacco or illicit drugs  Current medications and supplements including opioid prescriptions. Patient is not currently taking opioid prescriptions. Functional ability and status Nutritional status Physical activity Advanced directives List of other physicians Hospitalizations, surgeries, and ER visits in previous 12 months Vitals Screenings to include cognitive, depression, and falls Referrals and appointments  In addition, I have reviewed and discussed with patient certain preventive protocols, quality metrics, and best practice recommendations. A written personalized care plan for preventive services as well as general preventive health recommendations were provided to patient.     LaPearlean BrownieCMSeibert 12/07/2021   Nurse Notes:

## 2022-02-09 ENCOUNTER — Other Ambulatory Visit: Payer: Self-pay | Admitting: Internal Medicine

## 2022-02-16 ENCOUNTER — Ambulatory Visit: Payer: Medicare Other | Admitting: Physician Assistant

## 2022-03-20 ENCOUNTER — Other Ambulatory Visit: Payer: Self-pay | Admitting: Internal Medicine

## 2022-07-03 ENCOUNTER — Telehealth: Payer: Self-pay | Admitting: Internal Medicine

## 2022-07-03 ENCOUNTER — Ambulatory Visit (INDEPENDENT_AMBULATORY_CARE_PROVIDER_SITE_OTHER): Payer: Medicare Other | Admitting: Internal Medicine

## 2022-07-03 ENCOUNTER — Other Ambulatory Visit: Payer: Self-pay | Admitting: Internal Medicine

## 2022-07-03 ENCOUNTER — Encounter: Payer: Self-pay | Admitting: Internal Medicine

## 2022-07-03 VITALS — BP 110/56 | HR 74 | Temp 98.8°F | Resp 16 | Ht 66.0 in | Wt 198.2 lb

## 2022-07-03 DIAGNOSIS — Z8719 Personal history of other diseases of the digestive system: Secondary | ICD-10-CM | POA: Diagnosis not present

## 2022-07-03 DIAGNOSIS — R319 Hematuria, unspecified: Secondary | ICD-10-CM | POA: Diagnosis not present

## 2022-07-03 DIAGNOSIS — R82998 Other abnormal findings in urine: Secondary | ICD-10-CM

## 2022-07-03 DIAGNOSIS — R1032 Left lower quadrant pain: Secondary | ICD-10-CM | POA: Diagnosis not present

## 2022-07-03 DIAGNOSIS — R10829 Rebound abdominal tenderness, unspecified site: Secondary | ICD-10-CM | POA: Diagnosis not present

## 2022-07-03 LAB — POCT URINALYSIS DIPSTICK
Bilirubin, UA: NEGATIVE
Glucose, UA: NEGATIVE
Ketones, UA: 40
Nitrite, UA: NEGATIVE
Protein, UA: NEGATIVE
Spec Grav, UA: <=1.005 — AB (ref 1.010–1.025)
Urobilinogen, UA: 0.2 E.U./dL
pH, UA: 5 (ref 5.0–8.0)

## 2022-07-03 MED ORDER — CIPROFLOXACIN HCL 500 MG PO TABS: 500.0000 mg | ORAL_TABLET | Freq: Two times a day (BID) | ORAL | 0 refills | Status: AC

## 2022-07-03 MED ORDER — ONDANSETRON HCL 4 MG PO TABS: 4.0000 mg | ORAL_TABLET | Freq: Three times a day (TID) | ORAL | 0 refills | Status: DC | PRN

## 2022-07-03 MED ORDER — METRONIDAZOLE 500 MG PO TABS: 500.0000 mg | ORAL_TABLET | Freq: Two times a day (BID) | ORAL | 0 refills | Status: AC

## 2022-07-03 MED ORDER — FLUCONAZOLE 150 MG PO TABS
ORAL_TABLET | ORAL | 0 refills | Status: DC
Start: 2022-07-03 — End: 2022-10-18

## 2022-07-03 NOTE — Patient Instructions (Signed)
She is to have CT of abdomen and pelvis tomorrow at [facility].  I think she may have acute diverticulitis.  However her urine specimen is abnormal and a culture was sent.  We will start her on Cipro and Flagyl this evening and give her Zofran if needed for nausea.  She is to stay with clear liquids until the study is completed.  She will call if symptoms worsen.  Suggest she continue to monitor her temperature.

## 2022-07-03 NOTE — Telephone Encounter (Signed)
Patient called and said she thinks she has diverticulitis , She said it started Friday afternoon. She said she has Diarrhea and hasn't been able to eat anything but has been drinking plenty of fluids. She said she has had it before. She asked if she can be seen today. When would you like to see her?

## 2022-07-03 NOTE — Progress Notes (Signed)
Patient Care Team: Margaree Mackintosh, MD as PCP - General (Internal Medicine) Suzi Roots as Physician Assistant (Dermatology)  Visit Date: 07/03/22  Subjective:    Patient ID: Anita Wagner , Female   DOB: 04-01-1955, 67 y.o.    MRN: [id number]   67 y.o. Female presents today for diarrhea,  left lower abdominal pain since the afternoon of 06/30/22. Appetite is diminished. Hydration is adequate. Denies fever, shaking chills. Has been drinking broths, coffee. History of acute diverticulitis September 2016. Does not believe it is a kidney stone.  Past Medical History:  Diagnosis Date   Anxiety    Diverticulitis    2016   DVT (deep venous thrombosis) (HCC)    calf- 2014   GERD (gastroesophageal reflux disease)    Seizures (HCC)    at age 30- blood sugar issue; none since     Family History  Problem Relation Age of Onset   Cancer Father    Congestive Heart Failure Mother    Colon cancer Neg Hx    Esophageal cancer Neg Hx    Rectal cancer Neg Hx    Stomach cancer Neg Hx       Social Hx: Married. Retired Oncologist, Smoker. One glass of wine daily.No children.     Review of Systems  Constitutional:  Negative for chills, fever and malaise/fatigue.  HENT:  Negative for congestion.   Eyes:  Negative for blurred vision.  Respiratory:  Negative for cough and shortness of breath.   Cardiovascular:  Negative for chest pain, palpitations and leg swelling.  Gastrointestinal:  Positive for abdominal pain and diarrhea. Negative for vomiting.  Musculoskeletal:  Negative for back pain.  Skin:  Negative for rash.  Neurological:  Negative for loss of consciousness and headaches.        Objective:   Vitals: BP (!) 110/56   Pulse 74   Temp 98.8 F (37.1 C) (Tympanic)   Resp 16   Ht 5\' 6"  (1.676 m)   Wt 198 lb 4 oz (89.9 kg)   SpO2 96%   BMI 32.00 kg/m    Physical Exam Vitals and nursing note reviewed.  Constitutional:      General: She is not  in acute distress.    Appearance: Normal appearance. She is not toxic-appearing.  HENT:     Head: Normocephalic and atraumatic.  Pulmonary:     Effort: Pulmonary effort is normal.  Abdominal:     General: Bowel sounds are normal.     Tenderness: There is abdominal tenderness in the left lower quadrant.     Comments: Tenderness LLQ with deep palpation. No CVA tenderness.  Skin:    General: Skin is warm and dry.  Neurological:     Mental Status: She is alert and oriented to person, place, and time. Mental status is at baseline.  Psychiatric:        Mood and Affect: Mood normal.        Behavior: Behavior normal.        Thought Content: Thought content normal.        Judgment: Judgment normal.     Mild rebound tenderness is present.  Results:   Studies obtained and personally reviewed by me:    Labs: Drawn today CBC with diff and B-met. U/A has trace occult blood and LE. Culture has been sent.      Component Value Date/Time   NA 142 08/30/2020 0926   K 4.3 08/30/2020 0926  CL 106 08/30/2020 0926   CO2 29 08/30/2020 0926   GLUCOSE 97 08/30/2020 0926   BUN 14 08/30/2020 0926   CREATININE 0.64 08/30/2020 0926   CALCIUM 9.4 08/30/2020 0926   PROT 7.3 06/09/2021 1133   ALBUMIN 3.9 10/25/2015 1014   AST 16 06/09/2021 1133   ALT 11 06/09/2021 1133   ALKPHOS 56 10/25/2015 1014   BILITOT 0.5 06/09/2021 1133   GFRNONAA 92 06/26/2019 1119   GFRAA 107 06/26/2019 1119     Lab Results  Component Value Date   WBC 6.3 08/30/2020   HGB 14.0 08/30/2020   HCT 41.9 08/30/2020   MCV 94.4 08/30/2020   PLT 241 08/30/2020    Lab Results  Component Value Date   CHOL 190 06/09/2021   HDL 57 06/09/2021   LDLCALC 107 (H) 06/09/2021   TRIG 144 06/09/2021   CHOLHDL 3.3 06/09/2021    Lab Results  Component Value Date   HGBA1C 5.8 (H) 02/01/2021     Lab Results  Component Value Date   TSH 2.09 08/30/2020      Assessment & Plan:   Acute Diverticulitis: urinalysis normal.  Has LLQ abdominal pain that is in specific area of LLQ without mild rebound tenderness. Prior hx of diverticulitis in 2016.  Today, prescribed ciprofloxacin 500 mg twice daily for 10 days, Flagyl 500 mg twice daily for 7 days, Zofran 4 mg every 8 hours as needed. Ordered CBC with Diff/Plat, BMET, CT abdomen/pelvis with contrast. Will contact with results. To have [facility] tomorrow at [facility] location.Stay on clear liquids. Prescribed Zofran if needed for nausea. Has Diflucan if needed for Candida vaginitis while on antibiotics.  Rule out UTI- doubt UTI. Urine has trace LE and trace occult blood  I,Alexander Ruley,acting as a scribe for Margaree Mackintosh, MD.,have documented all relevant documentation on the behalf of Margaree Mackintosh, MD,as directed by  Margaree Mackintosh, MD while in the presence of Margaree Mackintosh, MD.   I, Margaree Mackintosh, MD, have reviewed all documentation for this visit. The documentation on 07/03/22 for the exam, diagnosis, procedures, and orders are all accurate and complete.

## 2022-07-03 NOTE — Telephone Encounter (Signed)
Patient scheduled.

## 2022-07-04 ENCOUNTER — Telehealth: Payer: Self-pay

## 2022-07-04 ENCOUNTER — Ambulatory Visit
Admission: RE | Admit: 2022-07-04 | Discharge: 2022-07-04 | Disposition: A | Discharge status: Home / Self Care | Payer: Medicare Other | Source: Ambulatory Visit | Attending: Internal Medicine | Admitting: Internal Medicine

## 2022-07-04 ENCOUNTER — Telehealth: Payer: Self-pay | Admitting: Internal Medicine

## 2022-07-04 DIAGNOSIS — R10829 Rebound abdominal tenderness, unspecified site: Secondary | ICD-10-CM

## 2022-07-04 DIAGNOSIS — K5732 Diverticulitis of large intestine without perforation or abscess without bleeding: Secondary | ICD-10-CM | POA: Diagnosis not present

## 2022-07-04 DIAGNOSIS — R1032 Left lower quadrant pain: Secondary | ICD-10-CM

## 2022-07-04 DIAGNOSIS — Z8719 Personal history of other diseases of the digestive system: Secondary | ICD-10-CM

## 2022-07-04 LAB — CBC WITH DIFFERENTIAL/PLATELET
Absolute Monocytes: 792 cells/uL (ref 200–950)
Basophils Absolute: 36 cells/uL (ref 0–200)
Basophils Relative: 0.3 %
Eosinophils Absolute: 636 cells/uL — ABNORMAL HIGH (ref 15–500)
Eosinophils Relative: 5.3 %
HCT: 41.8 % (ref 35.0–45.0)
Hemoglobin: 14 g/dL (ref 11.7–15.5)
Lymphs Abs: 1428 cells/uL (ref 850–3900)
MCH: 30.6 pg (ref 27.0–33.0)
MCHC: 33.5 g/dL (ref 32.0–36.0)
MCV: 91.5 fL (ref 80.0–100.0)
MPV: 10.4 fL (ref 7.5–12.5)
Monocytes Relative: 6.6 %
Neutro Abs: 9108 cells/uL — ABNORMAL HIGH (ref 1500–7800)
Neutrophils Relative %: 75.9 %
Platelets: 263 10*3/uL (ref 140–400)
RBC: 4.57 10*6/uL (ref 3.80–5.10)
RDW: 12.1 % (ref 11.0–15.0)
Total Lymphocyte: 11.9 %
WBC: 12 10*3/uL — ABNORMAL HIGH (ref 3.8–10.8)

## 2022-07-04 LAB — URINE CULTURE
MICRO NUMBER:: 15119037
Result:: NO GROWTH
SPECIMEN QUALITY:: ADEQUATE

## 2022-07-04 LAB — BASIC METABOLIC PANEL
BUN: 11 mg/dL (ref 7–25)
CO2: 24 mmol/L (ref 20–32)
Calcium: 9.1 mg/dL (ref 8.6–10.4)
Chloride: 101 mmol/L (ref 98–110)
Creat: 0.72 mg/dL (ref 0.50–1.05)
Glucose, Bld: 92 mg/dL (ref 65–99)
Potassium: 4.6 mmol/L (ref 3.5–5.3)
Sodium: 136 mmol/L (ref 135–146)

## 2022-07-04 MED ORDER — IOPAMIDOL (ISOVUE-300) INJECTION 61%
100.0000 mL | Freq: Once | INTRAVENOUS | Status: AC | PRN
  Administered 2022-07-04: 100 mL via INTRAVENOUS

## 2022-07-04 NOTE — Telephone Encounter (Signed)
Radiology at [facility] ([facility]) me that pt was having severe diarrhea. I advised cancelling test. I called patient and she wants to have the test. We will try to call [facility]. MJB, MD

## 2022-07-04 NOTE — Telephone Encounter (Signed)
Anita Wagner from [facility] states that the patient was positive for active diverticulitis in the proximal sigmoid colon. Dr Lenord Fellers notified.

## 2022-07-06 ENCOUNTER — Telehealth: Payer: Self-pay | Admitting: Internal Medicine

## 2022-07-06 NOTE — Telephone Encounter (Signed)
Patient called and wanted to let Dr Lenord Fellers know she had got the 2 antibiotics ciprofloxacin (CIPRO) 500 MG tablet and metroNIDAZOLE (FLAGYL) 500 MG tablet, she said that she has been taking the flagyl but that she read all the pages on the cipro and she saw all the side effects and she doesn't want to take it. She wanted to know if just the one is fine or if she needs more than just the flagyl if something else can be called in to replace the cipro. Please advise. Call back is [phone]. She said she hasn't had issues with the flagyl

## 2022-07-10 NOTE — Telephone Encounter (Signed)
Patient called and said she's taking both antibiotics and she said it feels like her mouth is on fire and has heartburn and wanted to know what she can take to help with that over the counter. Please advise

## 2022-07-10 NOTE — Telephone Encounter (Signed)
Scheduled

## 2022-07-11 ENCOUNTER — Encounter: Payer: Self-pay | Admitting: Internal Medicine

## 2022-07-11 ENCOUNTER — Ambulatory Visit (INDEPENDENT_AMBULATORY_CARE_PROVIDER_SITE_OTHER): Payer: Medicare Other | Admitting: Internal Medicine

## 2022-07-11 VITALS — BP 114/60 | Temp 97.2°F | Ht 66.0 in | Wt 194.2 lb

## 2022-07-11 DIAGNOSIS — H938X3 Other specified disorders of ear, bilateral: Secondary | ICD-10-CM

## 2022-07-11 DIAGNOSIS — K2100 Gastro-esophageal reflux disease with esophagitis, without bleeding: Secondary | ICD-10-CM | POA: Diagnosis not present

## 2022-07-11 DIAGNOSIS — Z8719 Personal history of other diseases of the digestive system: Secondary | ICD-10-CM

## 2022-07-11 DIAGNOSIS — K5792 Diverticulitis of intestine, part unspecified, without perforation or abscess without bleeding: Secondary | ICD-10-CM | POA: Diagnosis not present

## 2022-07-11 NOTE — Progress Notes (Signed)
Patient Care Team: Margaree Mackintosh, MD as PCP - General (Internal Medicine) Suzi Roots as Physician Assistant (Dermatology)  Visit Date: 07/11/22  Subjective:    Patient ID: Anita Wagner , Female   DOB: Jul 22, 1955, 67 y.o.    MRN: [id number]   67 y.o. Female presents today for acid reflux. Has not been taking Dexilant because she thought it was an antacid. Seen here 07/03/22 for acute diverticulitis and given ciprofloxacin, metronidazole. Has been taking these for 7 days. Diagnosed with left colonic diverticulitis by CT on 07/04/22. Abdominal symptoms improved. Continues to have diarrhea while taking ciprofloxacin. Denies constipation.  Past Medical History:  Diagnosis Date   Anxiety    Diverticulitis    2016   DVT (deep venous thrombosis) (HCC)    calf- 2014   GERD (gastroesophageal reflux disease)    Seizures (HCC)    at age 57- blood sugar issue; none since     Family History  Problem Relation Age of Onset   Cancer Father    Congestive Heart Failure Mother    Colon cancer Neg Hx    Esophageal cancer Neg Hx    Rectal cancer Neg Hx    Stomach cancer Neg Hx     Social History   Social History Narrative   Not on file      Review of Systems  Constitutional:  Negative for fever and malaise/fatigue.  HENT:  Negative for congestion.   Eyes:  Negative for blurred vision.  Respiratory:  Negative for cough and shortness of breath.   Cardiovascular:  Negative for chest pain, palpitations and leg swelling.  Gastrointestinal:  Positive for diarrhea. Negative for abdominal pain, constipation, heartburn and vomiting.  Musculoskeletal:  Negative for back pain.  Skin:  Negative for rash.  Neurological:  Negative for loss of consciousness and headaches.        Objective:   Vitals: BP 114/60   Temp (!) 97.2 F (36.2 C) (Temporal)   Ht 5\' 6"  (1.676 m)   Wt 194 lb 4 oz (88.1 kg)   BMI 31.35 kg/m    Physical Exam Vitals and nursing note reviewed.   Constitutional:      General: She is not in acute distress.    Appearance: Normal appearance. She is not toxic-appearing.  HENT:     Head: Normocephalic and atraumatic.     Right Ear: Hearing, tympanic membrane, ear canal and external ear normal.     Left Ear: Hearing, ear canal and external ear normal.     Ears:     Comments: Left TM slightly full, no erythema, canal clear. Some cerumen in right canal, TM clear.    Mouth/Throat:     Comments: Pharynx clear. Pulmonary:     Effort: Pulmonary effort is normal.  Abdominal:     General: Abdomen is flat. Bowel sounds are normal. There is no distension.     Palpations: Abdomen is soft.     Tenderness: There is no abdominal tenderness.  Skin:    General: Skin is warm and dry.  Neurological:     Mental Status: She is alert and oriented to person, place, and time. Mental status is at baseline.  Psychiatric:        Mood and Affect: Mood normal.        Behavior: Behavior normal.        Thought Content: Thought content normal.        Judgment: Judgment normal.  Results:   Studies obtained and personally reviewed by me:  07/04/22 CT abdomen/pelvis with contrast showed left colonic diverticulosis, inflammatory stranding around the proximal sigmoid colon compatible with active diverticulitis.  Labs:       Component Value Date/Time   NA 136 07/03/2022 1447   K 4.6 07/03/2022 1447   CL 101 07/03/2022 1447   CO2 24 07/03/2022 1447   GLUCOSE 92 07/03/2022 1447   BUN 11 07/03/2022 1447   CREATININE 0.72 07/03/2022 1447   CALCIUM 9.1 07/03/2022 1447   PROT 7.3 06/09/2021 1133   ALBUMIN 3.9 10/25/2015 1014   AST 16 06/09/2021 1133   ALT 11 06/09/2021 1133   ALKPHOS 56 10/25/2015 1014   BILITOT 0.5 06/09/2021 1133   GFRNONAA 92 06/26/2019 1119   GFRAA 107 06/26/2019 1119     Lab Results  Component Value Date   WBC 12.0 (H) 07/03/2022   HGB 14.0 07/03/2022   HCT 41.8 07/03/2022   MCV 91.5 07/03/2022   PLT 263 07/03/2022     Lab Results  Component Value Date   CHOL 190 06/09/2021   HDL 57 06/09/2021   LDLCALC 107 (H) 06/09/2021   TRIG 144 06/09/2021   CHOLHDL 3.3 06/09/2021    Lab Results  Component Value Date   HGBA1C 5.8 (H) 02/01/2021     Lab Results  Component Value Date   TSH 2.09 08/30/2020      Assessment & Plan:   Left colonic diverticulitis: complete 7 days ciprofloxacin and then discontinue. Continue soft food diet for 2 days then progress diet slowly. Contact us if symptoms recur.   GERD seems to be aggravated by antibiotic given for diverticulitis. May stop antibiotic after 7 days of treatment as she is much improved. Take Dexilant and complete 7 days course of antibiotic then discontinue it.  Ears checked at her request. External ear canals not blocked.  Tympanic membranes are clear.    I,Alexander Ruley,acting as a Neurosurgeon for Margaree Mackintosh, MD.,have documented all relevant documentation on the behalf of Margaree Mackintosh, MD,as directed by  Margaree Mackintosh, MD while in the presence of Margaree Mackintosh, MD.   I, Margaree Mackintosh, MD, have reviewed all documentation for this visit. The documentation on 07/16/22 for the exam, diagnosis, procedures, and orders are all accurate and complete.

## 2022-07-16 DIAGNOSIS — Z8719 Personal history of other diseases of the digestive system: Secondary | ICD-10-CM | POA: Insufficient documentation

## 2022-07-16 NOTE — Patient Instructions (Signed)
Complete 7 days of Cipro then discontinue antibiotic.  Continue soft diet for couple of more days and then advance diet slowly.  Contact us if symptoms recur.  I think reflux is aggravated by the antibiotic given for diverticulitis.  May stop antibiotic after 7 days of treatment.  Take Dexilant for reflux symptoms.  Your ears look normal.  The TMs are clear and the canals are not blocked.  We are glad you are feeling better.

## 2022-07-18 ENCOUNTER — Ambulatory Visit: Payer: Medicare Other | Admitting: Internal Medicine

## 2022-07-31 ENCOUNTER — Other Ambulatory Visit: Payer: Self-pay | Admitting: Internal Medicine

## 2022-08-07 ENCOUNTER — Other Ambulatory Visit: Payer: Self-pay | Admitting: Internal Medicine

## 2022-10-12 ENCOUNTER — Ambulatory Visit (INDEPENDENT_AMBULATORY_CARE_PROVIDER_SITE_OTHER): Payer: Medicare Other | Admitting: Internal Medicine

## 2022-10-12 ENCOUNTER — Encounter: Payer: Self-pay | Admitting: Internal Medicine

## 2022-10-12 ENCOUNTER — Telehealth: Payer: Self-pay | Admitting: Internal Medicine

## 2022-10-12 VITALS — BP 120/80 | HR 66 | Temp 98.1°F | Ht 66.0 in | Wt 184.0 lb

## 2022-10-12 DIAGNOSIS — K5792 Diverticulitis of intestine, part unspecified, without perforation or abscess without bleeding: Secondary | ICD-10-CM | POA: Diagnosis not present

## 2022-10-12 DIAGNOSIS — K219 Gastro-esophageal reflux disease without esophagitis: Secondary | ICD-10-CM

## 2022-10-12 DIAGNOSIS — Z87891 Personal history of nicotine dependence: Secondary | ICD-10-CM

## 2022-10-12 DIAGNOSIS — R1030 Lower abdominal pain, unspecified: Secondary | ICD-10-CM

## 2022-10-12 LAB — POCT URINALYSIS DIP (CLINITEK)
Bilirubin, UA: NEGATIVE
Blood, UA: NEGATIVE
Glucose, UA: NEGATIVE mg/dL
Ketones, POC UA: NEGATIVE mg/dL
Leukocytes, UA: NEGATIVE
Nitrite, UA: NEGATIVE
POC PROTEIN,UA: NEGATIVE
Spec Grav, UA: 1.01 (ref 1.010–1.025)
Urobilinogen, UA: 0.2 U/dL
pH, UA: 6 (ref 5.0–8.0)

## 2022-10-12 MED ORDER — CIPROFLOXACIN HCL 500 MG PO TABS: 500.0000 mg | ORAL_TABLET | Freq: Two times a day (BID) | ORAL | 0 refills | Status: AC

## 2022-10-12 NOTE — Telephone Encounter (Signed)
Anita Wagner [phone]  Kathrin called to say she is starting to have little pain, nothing like before of diverticulitis, she started taking some Cipro she had on hand 2-3 days ago she said she had 9 days of it. She only has 2 pills of metronidazole, she has not taking any of them. She thinks she may have eat to many almonds. Should she keep taking the Cipro and can she get new prescription of the other or does she need to be seen.

## 2022-10-12 NOTE — Progress Notes (Addendum)
Patient Care Team: Margaree Mackintosh, MD as PCP - General (Internal Medicine) Suzi Roots as Physician Assistant (Dermatology)  Visit Date: 10/12/22  Subjective:    Patient ID: Anita Wagner , Female   DOB: [birth year], 67 y.o.    MRN: [id number]   67 y.o. Female presents today for LLQ abdominal pain since 10/10/22. She has a history of diverticulosis. 2016 colonoscopy showed mild diverticulosis in left colon. She believes the pain may be related to eating almonds. Denies constipation, diarrhea, blood in stool/rectum. She has taken 5 doses of leftover ciprofloxacin.  Had episode of acute abdominal pain late June 2024 and was found to have acute diverticulitis on CT. Was treated with Cipro fagyl and Zofran. Had first episode if diverticulitis in 2016.  Past Medical History:  Diagnosis Date   Anxiety    Diverticulitis    2016   DVT (deep venous thrombosis) (HCC)    calf- 2014   GERD (gastroesophageal reflux disease)    Seizures (HCC)    at age 3- blood sugar issue; none since     Family History  Problem Relation Age of Onset   Cancer Father    Congestive Heart Failure Mother    Colon cancer Neg Hx    Esophageal cancer Neg Hx    Rectal cancer Neg Hx    Stomach cancer Neg Hx     Social Hx: Retired from Therapist, sports. Married. Exercises regularly.     Review of Systems  Constitutional:  Negative for fever and malaise/fatigue.  HENT:  Negative for congestion.   Eyes:  Negative for blurred vision.  Respiratory:  Negative for cough and shortness of breath.   Cardiovascular:  Negative for chest pain, palpitations and leg swelling.  Gastrointestinal:  Positive for abdominal pain (LLQ). Negative for blood in stool, constipation, diarrhea and vomiting.  Musculoskeletal:  Negative for back pain.  Skin:  Negative for rash.  Neurological:  Negative for loss of consciousness and headaches.        Objective:   Vitals: BP 120/80   Pulse 66   Temp 98.1 F  (36.7 C)   Ht 5\' 6"  (1.676 m)   Wt 184 lb (83.5 kg)   SpO2 97%   BMI 29.70 kg/m    Physical Exam Vitals and nursing note reviewed.  Constitutional:      General: She is not in acute distress.    Appearance: Normal appearance. She is not toxic-appearing.  HENT:     Head: Normocephalic and atraumatic.  Pulmonary:     Effort: Pulmonary effort is normal.  Abdominal:     Tenderness: There is abdominal tenderness in the left lower quadrant.  Skin:    General: Skin is warm and dry.  Neurological:     Mental Status: She is alert and oriented to person, place, and time. Mental status is at baseline.  Psychiatric:        Mood and Affect: Mood normal.        Behavior: Behavior normal.        Thought Content: Thought content normal.        Judgment: Judgment normal.       Results:   Studies obtained and personally reviewed by me:   Labs:       Component Value Date/Time   NA 136 07/03/2022 1447   K 4.6 07/03/2022 1447   CL 101 07/03/2022 1447   CO2 24 07/03/2022 1447   GLUCOSE 92 07/03/2022 1447  BUN 11 07/03/2022 1447   CREATININE 0.72 07/03/2022 1447   CALCIUM 9.1 07/03/2022 1447   PROT 7.3 06/09/2021 1133   ALBUMIN 3.9 10/25/2015 1014   AST 16 06/09/2021 1133   ALT 11 06/09/2021 1133   ALKPHOS 56 10/25/2015 1014   BILITOT 0.5 06/09/2021 1133   GFRNONAA 92 06/26/2019 1119   GFRAA 107 06/26/2019 1119     Lab Results  Component Value Date   WBC 12.0 (H) 07/03/2022   HGB 14.0 07/03/2022   HCT 41.8 07/03/2022   MCV 91.5 07/03/2022   PLT 263 07/03/2022    Lab Results  Component Value Date   CHOL 190 06/09/2021   HDL 57 06/09/2021   LDLCALC 107 (H) 06/09/2021   TRIG 144 06/09/2021   CHOLHDL 3.3 06/09/2021    Lab Results  Component Value Date   HGBA1C 5.8 (H) 02/01/2021     Lab Results  Component Value Date   TSH 2.09 08/30/2020      Assessment & Plan:   Diverticulitis: urinalysis normal today. Prescribed ciprofloxacin 500 mg twice daily for  five days. Patient did not want 2 drug regimen which included Flagyl. Contact us if symptoms worsen or do not improve. Urine culture is negative.Adivise clear liquids for a couple of days and advance diet slowly. Pt prefers not to take Flagyl. Clear liquids and advance diet slowly. Continue Dexilant for GE reflux.   I,Alexander Ruley,acting as a Neurosurgeon for Margaree Mackintosh, MD.,have documented all relevant documentation on the behalf of Margaree Mackintosh, MD,as directed by  Margaree Mackintosh, MD while in the presence of Margaree Mackintosh, MD.   I, Margaree Mackintosh, MD, have reviewed all documentation for this visit. The documentation on 10/30/22 for the exam, diagnosis, procedures, and orders are all accurate and complete.

## 2022-10-12 NOTE — Telephone Encounter (Signed)
scheduled

## 2022-10-18 ENCOUNTER — Other Ambulatory Visit: Payer: Self-pay | Admitting: Internal Medicine

## 2022-10-18 MED ORDER — FLUCONAZOLE 150 MG PO TABS: 150.0000 mg | ORAL_TABLET | Freq: Once | ORAL | 0 refills | Status: AC

## 2022-10-18 NOTE — Telephone Encounter (Signed)
Anita Wagner [id number]  Anita Wagner called to say the antibiotic has gave her a yeast infection. She wants to know if you could please call her in something for it.

## 2022-10-18 NOTE — Telephone Encounter (Signed)
Yes, I have pended it.

## 2022-10-30 NOTE — Patient Instructions (Signed)
Clear liquids for 24-48 hours and advance diet slowly as tolerated.  Take Flagyl as directed. Call if no improvement in 48 hours or sooner if worse.

## 2022-12-11 NOTE — Progress Notes (Signed)
Annual Wellness Visit    Patient Care Team: Carlitos Bottino, Luanna Cole, MD as PCP - General (Internal Medicine) Suzi Roots as Physician Assistant (Dermatology)  Visit Date: 12/18/22   Chief Complaint  Patient presents with   Medicare Wellness   Annual Exam    Subjective:   Patient: Anita Wagner, Female    DOB: 1955-06-04, 66 y.o.   MRN: [id number]  Anita Wagner is a 67 y.o. Female who presents today for her Annual Wellness Visit. History of anxiety, diverticulitis, DVT, GERD, seizures, hyperlipidemia.  History of anxiety treated with clonazepam 0.5 mg twice daily as needed.  History of impaired glucose tolerance that is currently diet and exercise controlled. HGBA1c at 6.0% on 12/14/22, up from 5.8% on 02/01/21.  History of hyperlipidemia treated with simvastatin 10 mg three times weekly. She was off of this for a period when she was trying to control with diet and exercise and subsequently restarted with 5 mg three times weekly. CHOL elevated at 221, LDL elevated at 133. She is exercising 3-4 times weekly.  History of musculoskeletal pain treated with meloxicam 15 mg daily.  History of GERD treated with dexlansoprazole 60 mg daily.  History of COPD treated with albuterol inhaler as needed.  2016 colonoscopy showed mild diverticulosis in left colon.   Remote history of DVT in December 2015 related to taking a long car trip, estrogen replacement and smoking. She is currently smoking less than one pack of cigarettes daily.  Multiple drug intolerances including Augmentin, amoxicillin, sulfa, Levaquin.  Intolerant of erythromycin but can take Zithromax.  History of allergic rhinitis which is longstanding.  History of eosinophilia.  History of sensorineural hearing loss.  Benign left breast needle biopsy 2004.  History of Morton's neuroma right foot and had injections in 2021 from podiatrist.  She is due for colonoscopy.  12/14/22 labs reviewed today.  Glucose normal. Kidney, liver functions normal. Electrolytes normal. Blood proteins normal. CBC normal. TSH at 1.75.  Social history: She has a high school education and retired as a Tour manager. Husband is a retired Teacher, adult education. Has smoked for well over 20 years. 1 glass of wine daily. This is her second marriage. No children. First marriage ended in divorce.  Family history: Mother passed away with history of dementia, congestive heart failure and arthritis. Father died in 82 with lung cancer. 1 brother with history of bipolar disorder.   Past Medical History:  Diagnosis Date   Anxiety    Diverticulitis    2016   DVT (deep venous thrombosis) (HCC)    calf- 2014   GERD (gastroesophageal reflux disease)    Seizures (HCC)    at age 5- blood sugar issue; none since     Family History  Problem Relation Age of Onset   Cancer Father    Congestive Heart Failure Mother    Colon cancer Neg Hx    Esophageal cancer Neg Hx    Rectal cancer Neg Hx    Stomach cancer Neg Hx          Review of Systems  Constitutional:  Negative for chills, fever, malaise/fatigue and weight loss.  HENT:  Negative for hearing loss, sinus pain and sore throat.   Respiratory:  Negative for cough, hemoptysis and shortness of breath.   Cardiovascular:  Negative for chest pain, palpitations, leg swelling and PND.  Gastrointestinal:  Negative for abdominal pain, constipation, diarrhea, heartburn, nausea and vomiting.  Genitourinary:  Negative for dysuria, frequency and  urgency.  Musculoskeletal:  Negative for back pain, myalgias and neck pain.  Skin:  Negative for itching and rash.  Neurological:  Negative for dizziness, tingling, seizures and headaches.  Endo/Heme/Allergies:  Negative for polydipsia.  Psychiatric/Behavioral:  Negative for depression. The patient is not nervous/anxious.       Objective:   Vitals: BP 130/80   Pulse 87   Ht 5\' 6"  (1.676 m)   Wt 186 lb (84.4 kg)    SpO2 97%   BMI 30.02 kg/m   Physical Exam Vitals and nursing note reviewed.  Constitutional:      General: She is not in acute distress.    Appearance: Normal appearance. She is not ill-appearing or toxic-appearing.  HENT:     Head: Normocephalic and atraumatic.     Right Ear: Hearing, tympanic membrane, ear canal and external ear normal.     Left Ear: Hearing, tympanic membrane, ear canal and external ear normal.     Mouth/Throat:     Pharynx: Oropharynx is clear.  Eyes:     Extraocular Movements: Extraocular movements intact.     Pupils: Pupils are equal, round, and reactive to light.  Neck:     Thyroid: No thyroid mass, thyromegaly or thyroid tenderness.     Vascular: No carotid bruit.  Cardiovascular:     Rate and Rhythm: Normal rate and regular rhythm. No extrasystoles are present.    Pulses:          Dorsalis pedis pulses are 1+ on the right side and 1+ on the left side.     Heart sounds: Normal heart sounds. No murmur heard.    No friction rub. No gallop.  Pulmonary:     Effort: Pulmonary effort is normal.     Breath sounds: Normal breath sounds. No decreased breath sounds, wheezing, rhonchi or rales.  Chest:     Chest wall: No mass.  Breasts:    Right: No mass.     Left: No mass.  Abdominal:     Palpations: Abdomen is soft. There is no hepatomegaly, splenomegaly or mass.     Tenderness: There is no abdominal tenderness.     Hernia: No hernia is present.  Musculoskeletal:     Cervical back: Normal range of motion.     Right lower leg: No edema.     Left lower leg: No edema.  Lymphadenopathy:     Cervical: No cervical adenopathy.     Upper Body:     Right upper body: No supraclavicular adenopathy.     Left upper body: No supraclavicular adenopathy.  Skin:    General: Skin is warm and dry.  Neurological:     General: No focal deficit present.     Mental Status: She is alert and oriented to person, place, and time. Mental status is at baseline.     Sensory:  Sensation is intact.     Motor: Motor function is intact. No weakness.     Deep Tendon Reflexes: Reflexes are normal and symmetric.     Reflex Scores:      Bicep reflexes are 2+ on the right side and 2+ on the left side.      Patellar reflexes are 2+ on the right side and 2+ on the left side. Psychiatric:        Attention and Perception: Attention normal.        Mood and Affect: Mood normal.        Speech: Speech normal.  Behavior: Behavior normal.        Thought Content: Thought content normal.        Cognition and Memory: Cognition normal.        Judgment: Judgment normal.      Most recent functional status assessment:    12/18/2022    2:56 PM  In your present state of health, do you have any difficulty performing the following activities:  Hearing? 0  Vision? 0  Difficulty concentrating or making decisions? 0  Walking or climbing stairs? 0  Dressing or bathing? 0  Doing errands, shopping? 0  Preparing Food and eating ? N  Using the Toilet? N  In the past six months, have you accidently leaked urine? N  Do you have problems with loss of bowel control? N  Managing your Medications? N  Managing your Finances? N  Housekeeping or managing your Housekeeping? N   Most recent fall risk assessment:    12/18/2022    2:56 PM  Fall Risk   Falls in the past year? 0  Number falls in past yr: 0  Injury with Fall? 0  Risk for fall due to : No Fall Risks  Follow up Falls evaluation completed;Education provided;Falls prevention discussed    Most recent depression screenings:    12/18/2022    2:57 PM 12/07/2021    2:07 PM  PHQ 2/9 Scores  PHQ - 2 Score 0 0   Most recent cognitive screening:    12/18/2022    3:13 PM  6CIT Screen  What Year? 0 points  What month? 0 points  What time? 0 points  Count back from 20 0 points  Months in reverse 0 points  Repeat phrase 0 points  Total Score 0 points     Results:   Studies obtained and personally reviewed by  me:  2016 colonoscopy showed mild diverticulosis in left colon.   Labs:       Component Value Date/Time   NA 141 12/14/2022 0924   K 5.3 12/14/2022 0924   CL 104 12/14/2022 0924   CO2 29 12/14/2022 0924   GLUCOSE 98 12/14/2022 0924   BUN 15 12/14/2022 0924   CREATININE 0.73 12/14/2022 0924   CALCIUM 9.8 12/14/2022 0924   PROT 7.6 12/14/2022 0924   ALBUMIN 3.9 10/25/2015 1014   AST 13 12/14/2022 0924   ALT 6 12/14/2022 0924   ALKPHOS 56 10/25/2015 1014   BILITOT 0.4 12/14/2022 0924   GFRNONAA 92 06/26/2019 1119   GFRAA 107 06/26/2019 1119     Lab Results  Component Value Date   WBC 6.1 12/14/2022   HGB 14.3 12/14/2022   HCT 42.7 12/14/2022   MCV 91.6 12/14/2022   PLT 260 12/14/2022    Lab Results  Component Value Date   CHOL 221 (H) 12/14/2022   HDL 69 12/14/2022   LDLCALC 133 (H) 12/14/2022   TRIG 91 12/14/2022   CHOLHDL 3.2 12/14/2022    Lab Results  Component Value Date   HGBA1C 6.0 (H) 12/14/2022     Lab Results  Component Value Date   TSH 1.75 12/14/2022    Assessment & Plan:   Anxiety: stable with clonazepam 0.5 mg twice daily as needed.  Impaired glucose tolerance: currently diet and exercise controlled. HGBA1c at 6.0% on 12/14/22, up from 5.8% on 02/01/21.  Hyperlipidemia: restart simvastatin 10 mg three times weekly.   Musculoskeletal pain: treated with meloxicam 15 mg daily.  GERD: treated with dexlansoprazole 60 mg daily.  Asthma: treated  with albuterol inhaler as needed.  Bimanual exam deferred status post TAH/BSO.  Ordered repeat mammogram.  2016 colonoscopy showed mild diverticulosis in left colon. Repeat colonoscopy deferred for 3-6 months at patient's request.  Ordered lung cancer screening.  Vaccine counseling: UTD on high-dose flu booster.  Return in 6 months for checkup or as needed.     Annual wellness visit done today including the all of the following: Reviewed patient's Family Medical History Reviewed and updated  list of patient's medical providers Assessment of cognitive impairment was done Assessed patient's functional ability Established a written schedule for health screening services Health Risk Assessent Completed and Reviewed  Discussed health benefits of physical activity, and encouraged her to engage in regular exercise appropriate for her age and condition.        I,Alexander Ruley,acting as a Neurosurgeon for Margaree Mackintosh, MD.,have documented all relevant documentation on the behalf of Margaree Mackintosh, MD,as directed by  Margaree Mackintosh, MD while in the presence of Margaree Mackintosh, MD.   I, Margaree Mackintosh, MD, have reviewed all documentation for this visit. The documentation on 01/06/23 for the exam, diagnosis, procedures, and orders are all accurate and complete.

## 2022-12-14 ENCOUNTER — Other Ambulatory Visit: Payer: Medicare Other

## 2022-12-14 DIAGNOSIS — E782 Mixed hyperlipidemia: Secondary | ICD-10-CM | POA: Diagnosis not present

## 2022-12-14 DIAGNOSIS — R7302 Impaired glucose tolerance (oral): Secondary | ICD-10-CM

## 2022-12-14 DIAGNOSIS — R911 Solitary pulmonary nodule: Secondary | ICD-10-CM

## 2022-12-14 DIAGNOSIS — M19041 Primary osteoarthritis, right hand: Secondary | ICD-10-CM

## 2022-12-14 DIAGNOSIS — K219 Gastro-esophageal reflux disease without esophagitis: Secondary | ICD-10-CM

## 2022-12-14 DIAGNOSIS — Z0000 Encounter for general adult medical examination without abnormal findings: Secondary | ICD-10-CM

## 2022-12-15 LAB — CBC WITH DIFFERENTIAL/PLATELET
Absolute Lymphocytes: 1397 {cells}/uL (ref 850–3900)
Absolute Monocytes: 329 {cells}/uL (ref 200–950)
Basophils Absolute: 92 {cells}/uL (ref 0–200)
Basophils Relative: 1.5 %
Eosinophils Absolute: 183 {cells}/uL (ref 15–500)
Eosinophils Relative: 3 %
HCT: 42.7 % (ref 35.0–45.0)
Hemoglobin: 14.3 g/dL (ref 11.7–15.5)
MCH: 30.7 pg (ref 27.0–33.0)
MCHC: 33.5 g/dL (ref 32.0–36.0)
MCV: 91.6 fL (ref 80.0–100.0)
MPV: 10.3 fL (ref 7.5–12.5)
Monocytes Relative: 5.4 %
Neutro Abs: 4099 {cells}/uL (ref 1500–7800)
Neutrophils Relative %: 67.2 %
Platelets: 260 10*3/uL (ref 140–400)
RBC: 4.66 10*6/uL (ref 3.80–5.10)
RDW: 12.2 % (ref 11.0–15.0)
Total Lymphocyte: 22.9 %
WBC: 6.1 10*3/uL (ref 3.8–10.8)

## 2022-12-15 LAB — COMPLETE METABOLIC PANEL WITH GFR
AG Ratio: 1.5 (calc) (ref 1.0–2.5)
ALT: 6 U/L (ref 6–29)
AST: 13 U/L (ref 10–35)
Albumin: 4.5 g/dL (ref 3.6–5.1)
Alkaline phosphatase (APISO): 68 U/L (ref 37–153)
BUN: 15 mg/dL (ref 7–25)
CO2: 29 mmol/L (ref 20–32)
Calcium: 9.8 mg/dL (ref 8.6–10.4)
Chloride: 104 mmol/L (ref 98–110)
Creat: 0.73 mg/dL (ref 0.50–1.05)
Globulin: 3.1 g/dL (ref 1.9–3.7)
Glucose, Bld: 98 mg/dL (ref 65–99)
Potassium: 5.3 mmol/L (ref 3.5–5.3)
Sodium: 141 mmol/L (ref 135–146)
Total Bilirubin: 0.4 mg/dL (ref 0.2–1.2)
Total Protein: 7.6 g/dL (ref 6.1–8.1)
eGFR: 90 mL/min/{1.73_m2} (ref >=60)

## 2022-12-15 LAB — LIPID PANEL
Cholesterol: 221 mg/dL — ABNORMAL HIGH (ref <200)
HDL: 69 mg/dL (ref >=50)
LDL Cholesterol (Calc): 133 mg/dL — ABNORMAL HIGH
Non-HDL Cholesterol (Calc): 152 mg/dL — ABNORMAL HIGH (ref <130)
Total CHOL/HDL Ratio: 3.2 (calc) (ref <5.0)
Triglycerides: 91 mg/dL (ref <150)

## 2022-12-15 LAB — HEMOGLOBIN A1C
Hgb A1c MFr Bld: 6 %{Hb} — ABNORMAL HIGH (ref <5.7)
Mean Plasma Glucose: 126 mg/dL
eAG (mmol/L): 7 mmol/L

## 2022-12-15 LAB — TSH: TSH: 1.75 m[IU]/L (ref 0.40–4.50)

## 2022-12-18 ENCOUNTER — Encounter: Payer: Self-pay | Admitting: Internal Medicine

## 2022-12-18 ENCOUNTER — Ambulatory Visit (INDEPENDENT_AMBULATORY_CARE_PROVIDER_SITE_OTHER): Payer: Medicare Other | Admitting: Internal Medicine

## 2022-12-18 VITALS — BP 130/80 | HR 87 | Ht 66.0 in | Wt 186.0 lb

## 2022-12-18 DIAGNOSIS — K219 Gastro-esophageal reflux disease without esophagitis: Secondary | ICD-10-CM | POA: Diagnosis not present

## 2022-12-18 DIAGNOSIS — M19041 Primary osteoarthritis, right hand: Secondary | ICD-10-CM | POA: Diagnosis not present

## 2022-12-18 DIAGNOSIS — Z8659 Personal history of other mental and behavioral disorders: Secondary | ICD-10-CM | POA: Diagnosis not present

## 2022-12-18 DIAGNOSIS — J432 Centrilobular emphysema: Secondary | ICD-10-CM

## 2022-12-18 DIAGNOSIS — R7302 Impaired glucose tolerance (oral): Secondary | ICD-10-CM | POA: Diagnosis not present

## 2022-12-18 DIAGNOSIS — Z87891 Personal history of nicotine dependence: Secondary | ICD-10-CM

## 2022-12-18 DIAGNOSIS — Z1239 Encounter for other screening for malignant neoplasm of breast: Secondary | ICD-10-CM

## 2022-12-18 DIAGNOSIS — E782 Mixed hyperlipidemia: Secondary | ICD-10-CM

## 2022-12-18 DIAGNOSIS — F17200 Nicotine dependence, unspecified, uncomplicated: Secondary | ICD-10-CM

## 2022-12-18 DIAGNOSIS — Z8719 Personal history of other diseases of the digestive system: Secondary | ICD-10-CM

## 2022-12-18 DIAGNOSIS — Z86718 Personal history of other venous thrombosis and embolism: Secondary | ICD-10-CM

## 2022-12-18 DIAGNOSIS — Z0000 Encounter for general adult medical examination without abnormal findings: Secondary | ICD-10-CM

## 2022-12-18 DIAGNOSIS — Z6830 Body mass index (BMI) 30.0-30.9, adult: Secondary | ICD-10-CM

## 2022-12-18 DIAGNOSIS — M19042 Primary osteoarthritis, left hand: Secondary | ICD-10-CM | POA: Diagnosis not present

## 2022-12-18 LAB — POCT URINALYSIS DIP (CLINITEK)
Bilirubin, UA: NEGATIVE
Blood, UA: NEGATIVE
Glucose, UA: NEGATIVE mg/dL
Ketones, POC UA: NEGATIVE mg/dL
Leukocytes, UA: NEGATIVE
Nitrite, UA: NEGATIVE
POC PROTEIN,UA: NEGATIVE
Spec Grav, UA: 1.01 (ref 1.010–1.025)
Urobilinogen, UA: 0.2 U/dL
pH, UA: 6.5 (ref 5.0–8.0)

## 2022-12-22 ENCOUNTER — Ambulatory Visit (INDEPENDENT_AMBULATORY_CARE_PROVIDER_SITE_OTHER): Payer: Medicare Other

## 2022-12-22 VITALS — BP 120/80 | HR 70 | Ht 66.0 in | Wt 186.0 lb

## 2022-12-22 DIAGNOSIS — Z23 Encounter for immunization: Secondary | ICD-10-CM | POA: Diagnosis not present

## 2022-12-22 NOTE — Progress Notes (Signed)
Patient is here for a pneumonia 20 vaccine.  Patient tolerated well.

## 2022-12-26 ENCOUNTER — Telehealth (INDEPENDENT_AMBULATORY_CARE_PROVIDER_SITE_OTHER): Payer: Medicare Other | Admitting: Internal Medicine

## 2022-12-26 ENCOUNTER — Telehealth: Payer: Self-pay | Admitting: Internal Medicine

## 2022-12-26 ENCOUNTER — Encounter: Payer: Self-pay | Admitting: Internal Medicine

## 2022-12-26 VITALS — BP 120/70 | Temp 97.9°F | Ht 66.0 in | Wt 186.0 lb

## 2022-12-26 DIAGNOSIS — Z87891 Personal history of nicotine dependence: Secondary | ICD-10-CM

## 2022-12-26 DIAGNOSIS — J22 Unspecified acute lower respiratory infection: Secondary | ICD-10-CM

## 2022-12-26 DIAGNOSIS — U071 COVID-19: Secondary | ICD-10-CM

## 2022-12-26 MED ORDER — HYDROCODONE BIT-HOMATROP MBR 5-1.5 MG/5ML PO SOLN: 5.0000 mL | Freq: Three times a day (TID) | ORAL | 0 refills | Status: DC | PRN

## 2022-12-26 MED ORDER — AZITHROMYCIN 250 MG PO TABS: ORAL_TABLET | ORAL | 0 refills | Status: AC

## 2022-12-26 NOTE — Progress Notes (Shared)
Patient Care Team: Margaree Mackintosh, MD as PCP - General (Internal Medicine) Glyn Ade, PA-C as Physician Assistant (Dermatology)  I connected with Anita Wagner on 12/26/22 at 12:31 PM by video enabled telemedicine visit and verified that I am speaking with the correct person using two identifiers.   I discussed the limitations, risks, security and privacy concerns of performing an evaluation and management service by telemedicine and the availability of in-person appointments. I also discussed with the patient that there may be a patient responsible charge related to this service. The patient expressed understanding and agreed to proceed. Patient identity confirmed using 2 identifiers: Anita Wagner and myself. Patient is in their home and I am in my office.    Other persons participating in the visit and their role in the encounter: Medical scribe, Doylene Bode  Patient's location: Home  Provider's location: Clinic   Time spent with video visit including chart review, interviewing patient , medical decision making  and e-scribing medications is 20 minutes  Chief Complaint: Cough   Subjective:    Patient ID: Anita Wagner , Female    DOB: 1955-04-02, 67 y.o.    MRN: [id number]   67 y.o. Female presents today for sick visit.   She presents today with hacking non-productive cough, hoarseness, headache, bodyaches, and chills. Today she took 2 at-home Covid tests with POS results. She reports that she most likely contracted it when she received her PNA vaccination 12/13. Her initial symptoms onset was a headache on Saturday 12/14 that lasted through Sunday 12/15 that did eventually resolve after taking Tylenol. Denies fever or loss of appetite.   Past Medical History:  Diagnosis Date   Anxiety    Diverticulitis    2016   DVT (deep venous thrombosis) (HCC)    calf- 2014   GERD (gastroesophageal reflux disease)    Seizures (HCC)    at age 19- blood sugar issue;  none since     Family History  Problem Relation Age of Onset   Cancer Father    Congestive Heart Failure Mother    Colon cancer Neg Hx    Esophageal cancer Neg Hx    Rectal cancer Neg Hx    Stomach cancer Neg Hx     Social hx: reviewed and unchanged     Review of Systems  Constitutional:  Negative for fever and malaise/fatigue.  HENT:  Negative for congestion.   Eyes:  Negative for blurred vision.  Respiratory:  Positive for cough. Negative for shortness of breath.   Cardiovascular:  Negative for chest pain, palpitations and leg swelling.  Gastrointestinal:  Negative for vomiting.  Musculoskeletal:  Negative for back pain.  Skin:  Negative for rash.  Neurological:  Positive for headaches. Negative for loss of consciousness.        Objective:   Vitals: BP 120/70   Temp 97.9 F (36.6 C)   Ht 5\' 6"  (1.676 m)   Wt 186 lb (84.4 kg)   BMI 30.02 kg/m    Physical Exam Vitals and nursing note reviewed.  Constitutional:      General: She is not in acute distress.    Appearance: Normal appearance. She is not toxic-appearing.  HENT:     Head: Normocephalic and atraumatic.  Pulmonary:     Effort: Pulmonary effort is normal.  Skin:    General: Skin is warm and dry.  Neurological:     Mental Status: She is alert and oriented to person, place, and  time. Mental status is at baseline.  Psychiatric:        Mood and Affect: Mood normal.        Behavior: Behavior normal.        Thought Content: Thought content normal.        Judgment: Judgment normal.      Results:   Studies obtained and personally reviewed by me:  Labs:       Component Value Date/Time   NA 141 12/14/2022 0924   K 5.3 12/14/2022 0924   CL 104 12/14/2022 0924   CO2 29 12/14/2022 0924   GLUCOSE 98 12/14/2022 0924   BUN 15 12/14/2022 0924   CREATININE 0.73 12/14/2022 0924   CALCIUM 9.8 12/14/2022 0924   PROT 7.6 12/14/2022 0924   ALBUMIN 3.9 10/25/2015 1014   AST 13 12/14/2022 0924   ALT 6  12/14/2022 0924   ALKPHOS 56 10/25/2015 1014   BILITOT 0.4 12/14/2022 0924   GFRNONAA 92 06/26/2019 1119   GFRAA 107 06/26/2019 1119     Lab Results  Component Value Date   WBC 6.1 12/14/2022   HGB 14.3 12/14/2022   HCT 42.7 12/14/2022   MCV 91.6 12/14/2022   PLT 260 12/14/2022    Lab Results  Component Value Date   CHOL 221 (H) 12/14/2022   HDL 69 12/14/2022   LDLCALC 133 (H) 12/14/2022   TRIG 91 12/14/2022   CHOLHDL 3.2 12/14/2022    Lab Results  Component Value Date   HGBA1C 6.0 (H) 12/14/2022     Lab Results  Component Value Date   TSH 1.75 12/14/2022       Assessment & Plan:  Covid 19 virus in fection- discussed treatment options. Does not have major symptoms but has some congestion Likely has lower respiratory infection Sending in 250 mg Azithromycin - take 2 tablets on day 1 and 1 tablet day 2-5. Also sending in 5 ml Hycodan syrup for cough - take every 8 hours as needed for cough. Stay hydrated . Quarantine 5 days. Rest often, but occasionally walk around and take deep breaths. Feel better soon and follow-up as needed!   I,Alexander Ruley,acting as a Neurosurgeon for Margaree Mackintosh, MD.,have documented all relevant documentation on the behalf of Margaree Mackintosh, MD,as directed by  Margaree Mackintosh, MD while in the presence of Margaree Mackintosh, MD.   I, Margaree Mackintosh, MD, have reviewed all documentation for this visit. The documentation on 12/27/22 for the exam, diagnosis, procedures, and orders are all accurate and complete.

## 2022-12-26 NOTE — Telephone Encounter (Signed)
Scheduled video visit 

## 2022-12-26 NOTE — Telephone Encounter (Signed)
Copied from CRM [id number]. Topic: Clinical - Medical Advice >> Dec 26, 2022  9:31 AM Donita Brooks wrote: Reason for CRM: pt is experiencing dry cough, ears are hurting, nose is running. Pt also tested positive for covid. Pt doesn't know if she should be seen in the office or if should make a virtual appointment to see Dr. Lenord Fellers

## 2022-12-26 NOTE — Telephone Encounter (Signed)
Do you want to work in one more video visit today at 12:45?

## 2022-12-27 NOTE — Patient Instructions (Addendum)
You have been diagnosed with acute COVID-19 virus infection.  You also have a lower respiratory tract infection.  We are sending in a prescription for Zithromax take 2 tabs day 1 followed by 1 tab days 2 through 5.  Take Hycodan 1 teaspoon every 8 hours as needed for cough.  Rest and stay well-hydrated.  Recommend quarantining at home for 5 days.  Walk around your house and take deep breaths frequently.  Call if not improving within a couple of days or sooner if worse.  May want to monitor pulse oximetry at home with a home pulse oximeter device.

## 2023-01-02 ENCOUNTER — Other Ambulatory Visit: Payer: Self-pay | Admitting: Internal Medicine

## 2023-01-05 ENCOUNTER — Other Ambulatory Visit: Payer: Self-pay

## 2023-01-05 ENCOUNTER — Emergency Department (HOSPITAL_BASED_OUTPATIENT_CLINIC_OR_DEPARTMENT_OTHER)
Admission: EM | Admit: 2023-01-05 | Discharge: 2023-01-05 | Disposition: A | Discharge status: Home / Self Care | Payer: Medicare Other | Attending: Emergency Medicine | Admitting: Emergency Medicine

## 2023-01-05 ENCOUNTER — Other Ambulatory Visit (HOSPITAL_BASED_OUTPATIENT_CLINIC_OR_DEPARTMENT_OTHER): Payer: Self-pay

## 2023-01-05 ENCOUNTER — Encounter (HOSPITAL_BASED_OUTPATIENT_CLINIC_OR_DEPARTMENT_OTHER): Payer: Self-pay | Admitting: Emergency Medicine

## 2023-01-05 DIAGNOSIS — M79661 Pain in right lower leg: Secondary | ICD-10-CM | POA: Diagnosis present

## 2023-01-05 DIAGNOSIS — I809 Phlebitis and thrombophlebitis of unspecified site: Secondary | ICD-10-CM | POA: Insufficient documentation

## 2023-01-05 DIAGNOSIS — Z7982 Long term (current) use of aspirin: Secondary | ICD-10-CM | POA: Insufficient documentation

## 2023-01-05 DIAGNOSIS — I8001 Phlebitis and thrombophlebitis of superficial vessels of right lower extremity: Secondary | ICD-10-CM | POA: Diagnosis not present

## 2023-01-05 NOTE — ED Triage Notes (Signed)
C/o R leg pain x 2 days ago w/ "knot in calf". States she has had a blood clot in same leg 10 years ago and states it feels similar. Legs appear symmetrical and equal temp.

## 2023-01-05 NOTE — ED Notes (Signed)
Upon discharge, walked patient to outpatient imaging to schedule her ultrasound.

## 2023-01-05 NOTE — Discharge Instructions (Addendum)
I think you most likely has a small clot in a varicose vein.  I did order you a DVT study.  Unfortunately we do not have ultrasound available right now.  They will try to reschedule you a time you can come back.  You could also discuss this with your family doctor and see what they recommend.

## 2023-01-05 NOTE — ED Provider Notes (Signed)
[place] EMERGENCY DEPARTMENT AT [facility] Provider Note   CSN: [id number] Arrival date & time: 01/05/23  0857     History  Chief Complaint  Patient presents with   Leg Pain    Anita Wagner is a 67 y.o. female.  67 yo F with a chief complaints of right calf pain.  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  She has had a nodule to the back of the leg for some time but became tender over the past couple days.  Denies obvious injury to the area.   Leg Pain      Home Medications Prior to Admission medications   Medication Sig Start Date End Date Taking? Authorizing Provider  acetaminophen (TYLENOL) 325 MG tablet Take 650 mg by mouth every 6 (six) hours as needed.    [provider]  albuterol (VENTOLIN HFA) 108 (90 Base) MCG/ACT inhaler TAKE 2 PUFFS BY MOUTH EVERY 6 HOURS AS NEEDED FOR WHEEZE OR SHORTNESS OF BREATH 02/09/21   Margaree Mackintosh, MD  aspirin 81 MG tablet Take 81 mg by mouth daily.    [provider]  clonazePAM (KLONOPIN) 0.5 MG tablet TAKE 1 TABLET BY MOUTH TWICE A DAY AS NEEDED 01/05/23   Margaree Mackintosh, MD  dexlansoprazole (DEXILANT) 60 MG capsule TAKE 1 CAPSULE BY MOUTH EVERY DAY 08/07/22   Margaree Mackintosh, MD  HYDROcodone bit-homatropine (HYCODAN) 5-1.5 MG/5ML syrup Take 5 mLs by mouth every 8 (eight) hours as needed for cough. 12/26/22   Margaree Mackintosh, MD  Melatonin 5 MG TABS Take by mouth at bedtime.    [provider]  meloxicam (MOBIC) 15 MG tablet Take 1 tablet (15 mg total) by mouth daily. 06/13/21   Margaree Mackintosh, MD  Multiple Vitamin (MULTIVITAMIN) capsule Take 1 capsule by mouth daily.    [provider]  ondansetron (ZOFRAN) 4 MG tablet Take 1 tablet (4 mg total) by mouth every 8 (eight) hours as needed for nausea or vomiting. 07/03/22   Margaree Mackintosh, MD  simvastatin (ZOCOR) 10 MG tablet One tab 3 times a week with supper 06/13/21   Margaree Mackintosh,  MD      Allergies    Amoxicillin-pot clavulanate, Sulfa antibiotics, Erythromycin, and Levofloxacin    Review of Systems   Review of Systems  Physical Exam Updated Vital Signs BP (!) 146/84   Pulse 92   Temp 98.4 F (36.9 C)   Resp 18   Ht 5\' 6"  (1.676 m)   Wt 84 kg   SpO2 99%   BMI 29.89 kg/m  Physical Exam Vitals and nursing note reviewed.  Constitutional:      General: She is not in acute distress.    Appearance: She is well-developed. She is not diaphoretic.  HENT:     Head: Normocephalic and atraumatic.  Eyes:     Pupils: Pupils are equal, round, and reactive to light.  Cardiovascular:     Rate and Rhythm: Normal rate and regular rhythm.     Heart sounds: No murmur heard.    No friction rub. No gallop.  Pulmonary:     Effort: Pulmonary effort is normal.     Breath sounds: No wheezing or rales.  Abdominal:     General: There is no distension.     Palpations: Abdomen is soft.     Tenderness: There is no abdominal tenderness.  Musculoskeletal:  General: No tenderness.     Cervical back: Normal range of motion and neck supple.     Comments: Patient with a small palpable nodule to the back of the right calf.  She has some varicosities to the anterior aspect of the leg.  Pulse motor and sensation are intact distally.  No appreciable edema.  Skin:    General: Skin is warm and dry.  Neurological:     Mental Status: She is alert and oriented to person, place, and time.  Psychiatric:        Behavior: Behavior normal.     ED Results / Procedures / Treatments   Labs (all labs ordered are listed, but only abnormal results are displayed) Labs Reviewed - No data to display  EKG None  Radiology No results found.  Procedures Procedures    Medications Ordered in ED Medications - No data to display  ED Course/ Medical Decision Making/ A&P                                 Medical Decision Making  67 yo F with a cc of right calf pain.  The patient  has had a nodule to the back of the right calf for some time it started being painful in the past day or so.  She has a history of DVT and so she was worried and came here for evaluation.  Unfortunately we do not have ultrasound available at this time.  I think it is less likely to be a DVT and more likely to be a thrombophlebitis of varicosity.  I will have the patient follow-up with her family doctor.  I did place an order to have a DVT study ordered as an outpatient if available.  11:41 AM:  I have discussed the diagnosis/risks/treatment options with the patient.  Evaluation and diagnostic testing in the emergency department does not suggest an emergent condition requiring admission or immediate intervention beyond what has been performed at this time.  They will follow up with PCP. We also discussed returning to the ED immediately if new or worsening sx occur. We discussed the sx which are most concerning (e.g., sudden worsening pain, fever, inability to tolerate by mouth) that necessitate immediate return. Medications administered to the patient during their visit and any new prescriptions provided to the patient are listed below.  Medications given during this visit Medications - No data to display   The patient appears reasonably screen and/or stabilized for discharge and I doubt any other medical condition or other [facility] requiring further screening, evaluation, or treatment in the ED at this time prior to discharge.          Final Clinical Impression(s) / ED Diagnoses Final diagnoses:  Thrombophlebitis    Rx / DC Orders ED Discharge Orders          Ordered    US Venous Img Lower Unilateral Right        01/05/23 1047              Melene Plan, [place] 01/05/23 1141

## 2023-01-06 ENCOUNTER — Other Ambulatory Visit (HOSPITAL_BASED_OUTPATIENT_CLINIC_OR_DEPARTMENT_OTHER): Payer: Self-pay | Admitting: Emergency Medicine

## 2023-01-06 ENCOUNTER — Emergency Department (HOSPITAL_BASED_OUTPATIENT_CLINIC_OR_DEPARTMENT_OTHER)
Admit: 2023-01-06 | Discharge: 2023-01-06 | Discharge status: Home / Self Care | Payer: Medicare Other | Attending: Emergency Medicine

## 2023-01-06 ENCOUNTER — Telehealth (HOSPITAL_BASED_OUTPATIENT_CLINIC_OR_DEPARTMENT_OTHER): Payer: Self-pay

## 2023-01-06 DIAGNOSIS — I82431 Acute embolism and thrombosis of right popliteal vein: Secondary | ICD-10-CM

## 2023-01-06 DIAGNOSIS — M79661 Pain in right lower leg: Secondary | ICD-10-CM | POA: Diagnosis not present

## 2023-01-06 DIAGNOSIS — R2241 Localized swelling, mass and lump, right lower limb: Secondary | ICD-10-CM | POA: Diagnosis not present

## 2023-01-06 DIAGNOSIS — I82561 Chronic embolism and thrombosis of right calf muscular vein: Secondary | ICD-10-CM | POA: Diagnosis not present

## 2023-01-06 DIAGNOSIS — M79604 Pain in right leg: Secondary | ICD-10-CM

## 2023-01-06 DIAGNOSIS — I809 Phlebitis and thrombophlebitis of unspecified site: Secondary | ICD-10-CM

## 2023-01-06 MED ORDER — APIXABAN 5 MG PO TABS: 5.0000 mg | ORAL_TABLET | Freq: Two times a day (BID) | ORAL | 0 refills | Status: DC

## 2023-01-06 NOTE — Telephone Encounter (Signed)
Patient seen yesterday in the ED on 01/05/2023 with pain to right calf.  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  Also with acute superficial thrombophlebitis.  Per chart review does have history of DVT.  Further chart review does reveal that she had labs done which showed normal renal function on 12/14/2022.  Will start patient on Eliquis.  She does have follow-up appointment with her primary doctor on Monday.

## 2023-01-08 ENCOUNTER — Ambulatory Visit (INDEPENDENT_AMBULATORY_CARE_PROVIDER_SITE_OTHER): Payer: Medicare Other | Admitting: Internal Medicine

## 2023-01-08 ENCOUNTER — Encounter: Payer: Self-pay | Admitting: Internal Medicine

## 2023-01-08 VITALS — BP 130/82 | HR 80 | Ht 66.0 in | Wt 185.0 lb

## 2023-01-08 DIAGNOSIS — Z86718 Personal history of other venous thrombosis and embolism: Secondary | ICD-10-CM

## 2023-01-08 DIAGNOSIS — I809 Phlebitis and thrombophlebitis of unspecified site: Secondary | ICD-10-CM

## 2023-01-08 DIAGNOSIS — Z87891 Personal history of nicotine dependence: Secondary | ICD-10-CM

## 2023-01-08 NOTE — Patient Instructions (Addendum)
She is currently  on Eliquis. Has had evaluation for DVT by Dr Cyndie Chime several years ago that was negative. Needs to quit smoking.This was reinforced today. I would like to ask Dr. Myna Hidalgo to see her and discuss this with her. He can help make determination if she needs Eliquis or can be treated with anti-inflammatory medication as well as any other follow up he feels is indicated.

## 2023-01-08 NOTE — Progress Notes (Signed)
Patient Care Team: Margaree Mackintosh, MD as PCP - General (Internal Medicine) Suzi Roots as Physician Assistant (Dermatology)  Visit Date: 01/08/23  Subjective:    Patient ID: Anita Wagner , Female   DOB: 1955-03-23, 67 y.o.    MRN: [id number]   67 y.o. Female presents today for ED Follow-up. Patient has a past medical history of smoking, estrogen replacement, and a DVT following an extended period of sitting which was treated with [place] Ramp for 3 months and halting her HRT. Has not had any recurrences until now.   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   On 12/27 she presented to ED at [facility] for right calf pain, requesting an evaluation due to her prior DVT. 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  Was started on 5 mg Eliquis.   Reports that she has had a nodule on her right leg for a while, though was not concerned until she began to feel pain in her right leg. Notes that when she began feeling pain she hadn't been very active due to being sick with Covid-19. She states that she has been working out without any difference to that nodule. Expresses that she isn't sure if she may have hit her leg.   Past Medical History:  Diagnosis Date   Anxiety    Diverticulitis    2016   DVT (deep venous thrombosis) (HCC)    calf-  2014   GERD (gastroesophageal reflux disease)    Seizures (HCC)    at age 28- blood sugar issue; none since     Family History  Problem Relation Age of Onset   Cancer Father    Congestive Heart Failure Mother    Colon cancer Neg Hx    Esophageal cancer Neg Hx    Rectal cancer Neg Hx    Stomach cancer Neg Hx     Social History   Social History Narrative   Not on file   Review of Systems  Constitutional:  Negative for fever and malaise/fatigue.  HENT:  Negative for congestion.   Eyes:  Negative for blurred vision.  Respiratory:  Negative for cough and shortness of breath.   Cardiovascular:  Negative for chest pain and palpitations.  Gastrointestinal:  Negative for vomiting.  Musculoskeletal:  Negative for back pain.       (+) R. Calf Pain  Skin:  Negative for rash.  Neurological:  Negative for loss of consciousness and headaches.      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   Labs:       Component Value Date/Time   NA 141 12/14/2022 0924   K 5.3 12/14/2022 0924   CL 104 12/14/2022 0924   CO2 29 12/14/2022 0924   GLUCOSE 98 12/14/2022 0924   BUN 15 12/14/2022 0924   CREATININE 0.73 12/14/2022 0924   CALCIUM 9.8 12/14/2022 0924   PROT 7.6 12/14/2022 0924   ALBUMIN 3.9 10/25/2015 1014   AST  13 12/14/2022 0924   ALT 6 12/14/2022 0924   ALKPHOS 56 10/25/2015 1014   BILITOT 0.4 12/14/2022 0924   GFRNONAA 92 06/26/2019 1119   GFRAA 107 06/26/2019 1119     Lab Results  Component Value Date   WBC 6.1 12/14/2022   HGB 14.3 12/14/2022   HCT 42.7 12/14/2022   MCV 91.6 12/14/2022   PLT 260 12/14/2022    Lab Results  Component Value Date   CHOL 221 (H) 12/14/2022   HDL 69 12/14/2022   LDLCALC 133 (H) 12/14/2022   TRIG 91 12/14/2022   CHOLHDL 3.2 12/14/2022    Lab Results  Component Value Date   HGBA1C 6.0 (H) 12/14/2022     Lab Results  Component Value Date   TSH 1.75 12/14/2022       Assessment & Plan:   Right Calf Pain: history of smoking, takesestrogen replacement, and a DVT. Has had a nodule on her right leg for a while, though was not concerned until she began to feel pain in her right leg. Is taking Eliquis 5 mg daily. Referral placed for Hematology, Dr Myna Hidalgo. Hx of previous DVT. Is a smoker and needs to quit. Has had hypercoagulable workup by Dr. Cyndie Chime in the past.   I,Emily Lagle,acting as a scribe for Margaree Mackintosh, MD.,have documented all relevant documentation on the behalf of Margaree Mackintosh, MD,as directed by  Margaree Mackintosh, MD while in the presence of Margaree Mackintosh, MD.   I, Margaree Mackintosh, MD, have reviewed all documentation for this visit. The documentation on 01/08/23 for the exam, diagnosis, procedures, and orders are all accurate and complete.

## 2023-01-18 ENCOUNTER — Telehealth: Payer: Self-pay | Admitting: Internal Medicine

## 2023-01-18 DIAGNOSIS — Z122 Encounter for screening for malignant neoplasm of respiratory organs: Secondary | ICD-10-CM

## 2023-01-18 NOTE — Telephone Encounter (Signed)
 Anita Wagner [phone]  Alexus stop by to see about getting the below test that you guys had talked about.  Lung Cancer Screening.

## 2023-01-18 NOTE — Telephone Encounter (Signed)
 Ordered

## 2023-01-23 ENCOUNTER — Other Ambulatory Visit: Payer: Self-pay | Admitting: Internal Medicine

## 2023-01-23 ENCOUNTER — Telehealth: Payer: Self-pay | Admitting: Internal Medicine

## 2023-01-23 NOTE — Telephone Encounter (Signed)
 Change the referral to Oncology to who patient wanted and also called patient and let her know that I had put in the referral for the Lung Cancer Screening for her and gave her the phone numbers to contact to get that set up.

## 2023-01-23 NOTE — Telephone Encounter (Signed)
 Copied from CRM [id number]. Topic: Referral - Request for Referral >> Jan 23, 2023  9:34 AM Rodell CROME wrote: Did the patient discuss referral with their provider in the last year? Yes (If No - schedule appointment) (If Yes - send message)  Appointment offered? No  Type of order/referral and detailed reason for visit: ref for imaging and hemotologist  Preference of office, provider, location: Imaging [facility]         Hemotolgist... Dr Fleet  [place]     If referral order, have you been seen by this specialty before? No (If Yes, this issue or another issue? When? Where?  Can we respond through MyChart? Yes

## 2023-01-24 ENCOUNTER — Telehealth: Payer: Self-pay | Admitting: Acute Care

## 2023-01-24 ENCOUNTER — Other Ambulatory Visit: Payer: Self-pay

## 2023-01-24 DIAGNOSIS — Z122 Encounter for screening for malignant neoplasm of respiratory organs: Secondary | ICD-10-CM

## 2023-01-24 DIAGNOSIS — F17210 Nicotine dependence, cigarettes, uncomplicated: Secondary | ICD-10-CM

## 2023-01-24 DIAGNOSIS — Z87891 Personal history of nicotine dependence: Secondary | ICD-10-CM

## 2023-01-24 NOTE — Telephone Encounter (Signed)
 Lung Cancer Screening Narrative/Criteria Questionnaire (Cigarette Smokers Only- No Cigars/Pipes/vapes)   Anita Wagner   SDMV:02/08/23 at 930am      Provider:  Rice Chamorro  1955/03/30                         LDCT: 02/13/23 at 2pm / [facility]    68 y.o.   Phone: [phone]   Lung Screening Narrative (confirm age 64-77 yrs Medicare / 50-80 yrs Private pay insurance)  Insurance information:[employer] medicare   Referring Provider:Baxley MD   This screening involves an initial phone call with a team member from our program. It is called a shared decision making visit. The initial meeting is required by  insurance and Medicare to make sure you understand the program. This appointment takes about 15-20 minutes to complete. You will complete the screening scan at your scheduled date/time.  This scan takes about 5-10 minutes to complete. You can eat and drink normally before and after the scan.  Criteria questions for Lung Cancer Screening:   Are you a current or former smoker? Current Age began smoking: 68 yo   If you are a former smoker, what year did you quit smoking? NA (within 15 yrs)   To calculate your smoking history, I need an accurate estimate of how many packs of cigarettes you smoked per day and for how many years. (Not just the number of PPD you are now smoking)   Years smoking 47 x Packs per day 3/4 to 1 = Pack years 41   (at least 20 pack yrs)   (Make sure they understand that we need to know how much they have smoked in the past, not just the number of PPD they are smoking now)  Do you have a personal history of cancer?  No    Do you have a family history of cancer? Yes  (cancer type and and relative) father/bronchus  Are you coughing up blood?  No  Have you had unexplained weight loss of 15 lbs or more in the last 6 months? No  It looks like you meet all criteria.  When would be a good time for us  to schedule you for this screening?   Additional information: N/A

## 2023-01-26 ENCOUNTER — Telehealth: Payer: Self-pay | Admitting: Internal Medicine

## 2023-01-26 ENCOUNTER — Encounter: Payer: Self-pay | Admitting: Internal Medicine

## 2023-01-26 ENCOUNTER — Ambulatory Visit (INDEPENDENT_AMBULATORY_CARE_PROVIDER_SITE_OTHER): Payer: Medicare Other | Admitting: Internal Medicine

## 2023-01-26 VITALS — BP 140/90 | HR 68 | Temp 98.2°F | Ht 66.0 in | Wt 187.0 lb

## 2023-01-26 DIAGNOSIS — Z780 Asymptomatic menopausal state: Secondary | ICD-10-CM

## 2023-01-26 DIAGNOSIS — Z87891 Personal history of nicotine dependence: Secondary | ICD-10-CM | POA: Diagnosis not present

## 2023-01-26 DIAGNOSIS — F419 Anxiety disorder, unspecified: Secondary | ICD-10-CM | POA: Diagnosis not present

## 2023-01-26 DIAGNOSIS — I809 Phlebitis and thrombophlebitis of unspecified site: Secondary | ICD-10-CM | POA: Diagnosis not present

## 2023-01-26 DIAGNOSIS — G479 Sleep disorder, unspecified: Secondary | ICD-10-CM

## 2023-01-26 DIAGNOSIS — L299 Pruritus, unspecified: Secondary | ICD-10-CM

## 2023-01-26 DIAGNOSIS — F439 Reaction to severe stress, unspecified: Secondary | ICD-10-CM

## 2023-01-26 MED ORDER — HYDROXYZINE PAMOATE 25 MG PO CAPS: 25.0000 mg | ORAL_CAPSULE | Freq: Three times a day (TID) | ORAL | 2 refills | Status: DC | PRN

## 2023-01-26 MED ORDER — CLONAZEPAM 1 MG PO TABS
ORAL_TABLET | ORAL | 2 refills | Status: DC
Start: 2023-01-26 — End: 2023-06-05

## 2023-01-26 NOTE — Progress Notes (Signed)
Patient Care Team: Margaree Mackintosh, MD as PCP - General (Internal Medicine) Suzi Roots as Physician Assistant (Dermatology)  Visit Date: 01/26/23  Subjective:   Chief Complaint  Patient presents with   Rash    Upper abd and back.   Insomnia    Unable to stay asleep.    Patient NW:GNFAOZH R Guest,Female DOB:1955-10-03,67 y.o. YQM:[id number]   68 y.o. Female presents today for acute visit with Insomnia. Patient has a past medical history of Anxiety.  History of Anxiety treated with 0.5 mg Klonopin BID PRN. She is having a lot of anxiety about her husband and dog's health. Going to bed at 10:30 PM and is waking up around 2 AM. She has stopped drinking alcohol after learning how it can affect her sleep. Has tried Melatonin with no relief.   She also endorses itching on her upper abdomen and back. Has used some Benadryl Anti-Itch Spray.   Notes that her superficial phlebitis on her right leg is improving though there is still some tenderness.  Discussed repeating Bone Density this year - she is agreeable to this.   Past Medical History:  Diagnosis Date   Anxiety    Diverticulitis    2016   DVT (deep venous thrombosis) (HCC)    calf- 2014   GERD (gastroesophageal reflux disease)    Seizures (HCC)    at age 76- blood sugar issue; none since    Family History  Problem Relation Age of Onset   Cancer Father    Congestive Heart Failure Mother    Colon cancer Neg Hx    Esophageal cancer Neg Hx    Rectal cancer Neg Hx    Stomach cancer Neg Hx    Social History   Social History Narrative   Not on file   ROS   Objective:  Vitals: BP (!) 140/90   Pulse 68   Temp 98.2 F (36.8 C)   Ht 5\' 6"  (1.676 m)   Wt 187 lb (84.8 kg)   SpO2 98%   BMI 30.18 kg/m   Physical Exam  Results:  Studies Obtained And Personally Reviewed By Me:  Imaging, colonoscopy, mammogram, bone density scan, echocardiogram, heart cath, stress test, CT calcium score, etc. I, Margaree Mackintosh, MD, have reviewed all documentation for this visit. The documentation on 02/09/23 for the exam, diagnosis, procedures, and orders are all accurate and complete.   Labs:     Component Value Date/Time   NA 141 12/14/2022 0924   K 5.3 12/14/2022 0924   CL 104 12/14/2022 0924   CO2 29 12/14/2022 0924   GLUCOSE 98 12/14/2022 0924   BUN 15 12/14/2022 0924   CREATININE 0.73 12/14/2022 0924   CALCIUM 9.8 12/14/2022 0924   PROT 7.6 12/14/2022 0924   ALBUMIN 3.9 10/25/2015 1014   AST 13 12/14/2022 0924   ALT 6 12/14/2022 0924   ALKPHOS 56 10/25/2015 1014   BILITOT 0.4 12/14/2022 0924   GFRNONAA 92 06/26/2019 1119   GFRAA 107 06/26/2019 1119    Lab Results  Component Value Date   WBC 6.1 12/14/2022   HGB 14.3 12/14/2022   HCT 42.7 12/14/2022   MCV 91.6 12/14/2022   PLT 260 12/14/2022   Lab Results  Component Value Date   CHOL 221 (H) 12/14/2022   HDL 69 12/14/2022   LDLCALC 133 (H) 12/14/2022   TRIG 91 12/14/2022   CHOLHDL 3.2 12/14/2022   Lab Results  Component Value Date   HGBA1C  6.0 (H) 12/14/2022    Lab Results  Component Value Date   TSH 1.75 12/14/2022   Assessment & Plan:  Anxiety treated with 0.5 mg Klonopin BID PRN. Having a lot of anxiety about her husband and dog's health. Going to bed at 10:30 PM, waking up around 2 AM. Stopped drinking alcohol after learning how it can affect her sleep. Has tried Melatonin with no relief. Increasing Klonopin to 1.0 mg - take 0.5 in morning and 1 mg at bedtime.   Acute Pruritis: upper abdomen and back. No rash on examination. Has used some Benadryl Anti-Itch Spray. Sending in 25 mg Vistaril - take 1 (one) capsule by mouth every 8 (eight) hours as needed. Also recommended she can try Sarna lotion.   Superficial Phlebitis on her right leg is improving though there is still some tenderness. Continue monitoring and contact us if symptoms recur or worsen.   Bone Density: Discussed repeating this year - she is agreeable to  this. DG Bone Density ordered.    I,Emily Lagle,acting as a Neurosurgeon for Margaree Mackintosh, MD.,have documented all relevant documentation on the behalf of Margaree Mackintosh, MD,as directed by  Margaree Mackintosh, MD while in the presence of Margaree Mackintosh, MD.   I, Margaree Mackintosh, MD, have reviewed all documentation for this visit. The documentation on 02/09/23 for the exam, diagnosis, procedures, and orders are all accurate and complete.

## 2023-01-26 NOTE — Telephone Encounter (Signed)
Copied from CRM [id number]. Topic: Clinical - Prescription Issue >> Jan 26, 2023  9:11 AM Carlatta H wrote: Reason for CRM: Patient was prescribed apixaban (ELIQUIS) 5 MG TABS tablet [[id number]] in the [facility] emergency department//She's been taking 2 a day and now if have itching on stomach and lower back//She would like know some other option for prescriptions//Please call

## 2023-01-26 NOTE — Telephone Encounter (Signed)
Done

## 2023-01-29 ENCOUNTER — Other Ambulatory Visit: Payer: Self-pay

## 2023-01-29 MED ORDER — SIMVASTATIN 20 MG PO TABS: 20.0000 mg | ORAL_TABLET | Freq: Every day | ORAL | 3 refills | Status: AC

## 2023-01-29 MED ORDER — APIXABAN 5 MG PO TABS: 5.0000 mg | ORAL_TABLET | Freq: Two times a day (BID) | ORAL | 5 refills | Status: DC

## 2023-02-02 ENCOUNTER — Encounter: Payer: Self-pay | Admitting: Internal Medicine

## 2023-02-08 ENCOUNTER — Ambulatory Visit: Payer: Medicare Other | Admitting: Physician Assistant

## 2023-02-08 ENCOUNTER — Encounter: Payer: Self-pay | Admitting: Physician Assistant

## 2023-02-08 DIAGNOSIS — F17210 Nicotine dependence, cigarettes, uncomplicated: Secondary | ICD-10-CM | POA: Diagnosis not present

## 2023-02-08 NOTE — Patient Instructions (Signed)

## 2023-02-08 NOTE — Progress Notes (Signed)
Virtual Visit via Telephone Note  I connected with Anita Wagner on 02/08/23 at  9:59 AM by telephone and verified that I am speaking with the correct person using two identifiers.  Location: Patient: home Provider: working virtually from home   I discussed the limitations, risks, security and privacy concerns of performing an evaluation and management service by telephone and the availability of in person appointments. I also discussed with the patient that there may be a patient responsible charge related to this service. The patient expressed understanding and agreed to proceed.     Shared Decision Making Visit Lung Cancer Screening Program [phone])   Eligibility: Age 68 Pack Years Smoking History Calculation 70 (# packs/per year x # years smoked) Recent History of coughing up blood  No Unexplained weight loss? No ( >Than 15 pounds within the last 6 months ) Prior History Lung / other cancer No (Diagnosis within the last 5 years already requiring surveillance chest CT Scans). Smoking Status Current Smoker  Visit Components: Discussion included one or more decision making aids. Yes Discussion included risk/benefits of screening. Yes Discussion included potential follow up diagnostic testing for abnormal scans. Yes Discussion included meaning and risk of over diagnosis. Yes Discussion included meaning and risk of False Positives. Yes Discussion included meaning of total radiation exposure. Yes  Counseling Included: Importance of adherence to annual lung cancer LDCT screening. Yes Impact of comorbidities on ability to participate in the program. Yes Ability and willingness to under diagnostic treatment: Yes  Smoking Cessation Counseling: Current Smokers:  Discussed importance of smoking cessation. Yes Information about tobacco cessation classes and interventions provided to patient. Yes Symptomatic Patient. No Diagnosis Code: Tobacco Use Z72.0 Asymptomatic Patient  Yes  Counseling (Intermediate counseling: > three minutes counseling) U0454 Information about tobacco cessation classes and interventions provided to patient. Yes Written Order for Lung Cancer Screening with LDCT placed in Epic. Yes (CT Chest Lung Cancer Screening Low Dose W/O CM) [id number] Z12.2-Screening of respiratory organs Z87.891-Personal history of nicotine dependence   I have spent 25 minutes of face to face/ virtual visit  time with the patient discussing the risks and benefits of lung cancer screening. We discussed the above noted topics. We paused at intervals to allow for questions to be asked and answered to ensure understanding.We discussed that the single most powerful action that anyone can take to decrease their risk of developing lung cancer is to quit smoking.  We discussed options for tools to aid in quitting smoking including nicotine replacement therapy, non-nicotine medications, support groups, Quit Smart classes, and behavior modification. We discussed that often times setting smaller, more achievable goals, such as eliminating 1 cigarette a day for a week and then 2 cigarettes a day for a week can be helpful in slowly decreasing the number of cigarettes smoked. I provided  them  with smoking cessation  information  with contact information for community resources, classes, free nicotine replacement therapy, and access to mobile apps, text messaging, and on-line smoking cessation help. I have also provided  them  the office contact information in the event they have any questions. We discussed the time and location of the scan, and that either Abigail Miyamoto RN, Karlton Lemon, RN  or I will call / send a letter with the results within 24-72 hours of receiving them. The patient verbalized understanding of all of  the above and had no further questions upon leaving the office. They have my contact information in the event they have any further  questions.  I spent 3 minutes counseling  on smoking cessation and the health risks of continued tobacco abuse.  I explained to the patient that there has been a high incidence of coronary artery disease noted on these exams. I explained that this is a non-gated exam therefore degree or severity cannot be determined. This patient is on statin therapy. I have asked the patient to follow-up with their PCP regarding any incidental finding of coronary artery disease and management with diet or medication as their PCP  feels is clinically indicated. The patient verbalized understanding of the above and had no further questions upon completion of the visit.    Darcella Gasman Lejuan Botto, PA-C

## 2023-02-09 NOTE — Patient Instructions (Addendum)
Multiple issues addressed today including anxiety pruritus superficial phlebitis of right leg, osteopenia/bone density study.  Adjusting Klonopin dose.  Take Vistaril for itching.  Have bone density study.

## 2023-02-13 ENCOUNTER — Ambulatory Visit (HOSPITAL_BASED_OUTPATIENT_CLINIC_OR_DEPARTMENT_OTHER)
Admission: RE | Admit: 2023-02-13 | Discharge: 2023-02-13 | Disposition: A | Discharge status: Home / Self Care | Payer: Medicare Other | Source: Ambulatory Visit | Attending: Internal Medicine | Admitting: Internal Medicine

## 2023-02-13 DIAGNOSIS — Z122 Encounter for screening for malignant neoplasm of respiratory organs: Secondary | ICD-10-CM | POA: Diagnosis not present

## 2023-02-13 DIAGNOSIS — F17210 Nicotine dependence, cigarettes, uncomplicated: Secondary | ICD-10-CM | POA: Insufficient documentation

## 2023-02-13 DIAGNOSIS — Z87891 Personal history of nicotine dependence: Secondary | ICD-10-CM | POA: Insufficient documentation

## 2023-02-15 ENCOUNTER — Inpatient Hospital Stay: Payer: Medicare Other

## 2023-02-15 ENCOUNTER — Inpatient Hospital Stay: Payer: Medicare Other | Attending: Hematology and Oncology | Admitting: Hematology and Oncology

## 2023-02-15 VITALS — BP 125/57 | HR 78 | Temp 97.7°F | Resp 16 | Ht 66.06 in | Wt 187.1 lb

## 2023-02-15 DIAGNOSIS — I471 Supraventricular tachycardia, unspecified: Secondary | ICD-10-CM | POA: Insufficient documentation

## 2023-02-15 DIAGNOSIS — Z86718 Personal history of other venous thrombosis and embolism: Secondary | ICD-10-CM | POA: Insufficient documentation

## 2023-02-15 DIAGNOSIS — Z7901 Long term (current) use of anticoagulants: Secondary | ICD-10-CM | POA: Insufficient documentation

## 2023-02-15 DIAGNOSIS — I8001 Phlebitis and thrombophlebitis of superficial vessels of right lower extremity: Secondary | ICD-10-CM | POA: Diagnosis not present

## 2023-02-15 DIAGNOSIS — K219 Gastro-esophageal reflux disease without esophagitis: Secondary | ICD-10-CM | POA: Insufficient documentation

## 2023-02-15 DIAGNOSIS — Z8616 Personal history of COVID-19: Secondary | ICD-10-CM | POA: Diagnosis not present

## 2023-02-15 DIAGNOSIS — I809 Phlebitis and thrombophlebitis of unspecified site: Secondary | ICD-10-CM | POA: Diagnosis not present

## 2023-02-15 DIAGNOSIS — F17210 Nicotine dependence, cigarettes, uncomplicated: Secondary | ICD-10-CM | POA: Diagnosis not present

## 2023-02-15 DIAGNOSIS — Z79899 Other long term (current) drug therapy: Secondary | ICD-10-CM | POA: Diagnosis not present

## 2023-02-15 DIAGNOSIS — Z809 Family history of malignant neoplasm, unspecified: Secondary | ICD-10-CM | POA: Insufficient documentation

## 2023-02-15 DIAGNOSIS — Z791 Long term (current) use of non-steroidal anti-inflammatories (NSAID): Secondary | ICD-10-CM | POA: Insufficient documentation

## 2023-02-15 DIAGNOSIS — Z7982 Long term (current) use of aspirin: Secondary | ICD-10-CM | POA: Diagnosis not present

## 2023-02-15 NOTE — Assessment & Plan Note (Signed)
 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  History of DVT Previous DVT in 2014, currently on Eliquis  due to recent superficial thrombophlebitis. No hereditary predisposition identified from previous testing. Added protein C, S activity and APLS testing today. I dont see a role for long term anticoagulation. It is less likely that she has a hereditary hypercoagulable state. Her previous DVT is more than likely from HRT in the past and concomitant smoking. -Discontinue Eliquis  in 1-2 weeks. -Resume low-dose aspirin after discontinuation of Eliquis . -We will convey the results of hypercoag work up from today and discuss with her in case she will need long term anticoagulation.  Superficial Thrombophlebitis Recent episode, currently improving. -Continue current management, expected resolution in 6-8 weeks.  General Health Maintenance -Encourage patient to schedule mammogram and colonoscopy. -Encourage continued efforts in weight loss and smoking cessation.

## 2023-02-15 NOTE — Progress Notes (Signed)
 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  History of DVT Previous DVT in 2014, currently on Eliquis  due to recent superficial thrombophlebitis. No hereditary predisposition identified from previous testing. Added protein C, S activity and APLS testing today. I dont see a role for long term anticoagulation. It is less likely that she has a hereditary hypercoagulable state. Her previous DVT is more than likely from HRT in the past and concomitant smoking. -Discontinue Eliquis  in 1-2 weeks. -Resume low-dose aspirin after discontinuation of Eliquis . -We will convey the results of hypercoag work up from today and discuss with her in case she will need long term anticoagulation.  Superficial Thrombophlebitis Recent episode, currently improving. -Continue current management, expected resolution in 6-8 weeks.  General Health Maintenance -Encourage patient to schedule mammogram and colonoscopy. -Encourage continued efforts in weight loss and smoking cessation.  Orders Placed This Encounter  Procedures   Lupus anticoagulant panel*    Standing Status:   Future    Number of Occurrences:   1    Expiration Date:   02/15/2024   Cardiolipin antibodies, IgG, IgM, IgA*    Standing Status:   Future    Number of Occurrences:   1    Expiration Date:   02/15/2024   Beta-2 -glycoprotein i abs, IgG/M/A    Standing Status:   Future    Number of Occurrences:   1    Expiration Date:   02/15/2024   Protein S activity*    Standing Status:   Future    Number of Occurrences:   1    Expiration Date:    02/15/2024   Protein C activity*    Standing Status:   Future    Number of Occurrences:   1    Expiration Date:   02/15/2024     HISTORY OF PRESENTING ILLNESS:  Anita Wagner 68 y.o. female is here because of personal history of DVT.  Discussed the use of AI scribe software for clinical note transcription with the patient, who gave verbal consent to proceed.  History of Present Illness    Anita Wagner is a 68 year old female with a history of deep vein thrombosis who presents for evaluation of a chronic blood clot in the right leg. She was referred by her primary care doctor to see a hematologist for further evaluation.  In December, she experienced pain in her right leg, which led to a visit to the emergency room. A vascular ultrasound revealed a superficial thrombophlebitis and an older chronic blood clot in the right calf extending behind the knee. She has been taking Eliquis  twice daily since December 28th. The superficial clot has decreased in size, and she has been engaging in regular exercise, including using the elliptical for an hour three times a week. She attributes the onset of pain to a period of inactivity during a COVID-19 infection in December.  She has a history of a blood clot behind her knee in 2014 or 2015, diagnosed as deep vein thrombosis (DVT) after being stuck in traffic for an hour. She was treated with Xarelto  for  two months at that time and had no further issues until recently.  She has a history of smoking and was on estrogen therapy at the time of her initial DVT, which she believes may have contributed to the clot formation. She has not reduced her smoking yet but is working on it.  She underwent a genetic test for clotting disorders in 2014 or 2015, which showed no hereditary predisposition. She has no family history of blood clots.  She has a history of a hysterectomy due to endometriosis, with no complications such as blood clots post-surgery. Rest of the  pertinent 10 point ROS reviewed and neg.  MEDICAL HISTORY:  Past Medical History:  Diagnosis Date   Anxiety    Diverticulitis    2016   DVT (deep venous thrombosis) (HCC)    calf- 2014   GERD (gastroesophageal reflux disease)    Seizures (HCC)    at age 54- blood sugar issue; none since    SURGICAL HISTORY: Past Surgical History:  Procedure Laterality Date   ABDOMINAL HYSTERECTOMY     [place] bso   ENDOMETRIAL ABLATION     TONSILLECTOMY      SOCIAL HISTORY: Social History   Socioeconomic History   Marital status: Married    Spouse name: Not on file   Number of children: Not on file   Years of education: Not on file   Highest education level: 12th grade  Occupational History   Not on file  Tobacco Use   Smoking status: Every Day    Current packs/day: 1.00    Average packs/day: 1 pack/day for 40.0 years (40.0 ttl pk-yrs)    Types: Cigarettes   Smokeless tobacco: Never  Substance and Sexual Activity   Alcohol use: Yes    Alcohol/week: 5.0 standard drinks of alcohol    Types: 5 Glasses of wine per week    Comment: daily- 1-2 glasses   Drug use: No   Sexual activity: Not Currently  Other Topics Concern   Not on file  Social History Narrative   Not on file   Social Drivers of Health   Financial Resource Strain: Low Risk  (01/08/2023)   Overall Financial Resource Strain (CARDIA)    Difficulty of Paying Living Expenses: Not hard at all  Food Insecurity: No Food Insecurity (01/08/2023)   Hunger Vital Sign    Worried About Running Out of Food in the Last Year: Never true    Ran Out of Food in the Last Year: Never true  Transportation Needs: No Transportation Needs (01/08/2023)   PRAPARE - Administrator, Civil Service (Medical): No    Lack of Transportation (Non-Medical): No  Physical Activity: Sufficiently Active (01/08/2023)   Exercise Vital Sign    Days of Exercise per Week: 3 days    Minutes of Exercise per Session: 120 min  Stress: No Stress  Concern Present (01/08/2023)   [employer] of Occupational Health - Occupational Stress Questionnaire    Feeling of Stress : Not at all  Social Connections: Socially Isolated (01/08/2023)   Social Connection and Isolation Panel [NHANES]    Frequency of Communication with Friends and Family: Once a week    Frequency of Social Gatherings with Friends and Family: Once a week    Attends Religious Services: Never    Database Administrator or Organizations: No    Attends Banker Meetings: Not on file    Marital Status: Married  Intimate Partner Violence: Not At Risk (12/07/2021)  Humiliation, Afraid, Rape, and Kick questionnaire    Fear of Current or Ex-Partner: No    Emotionally Abused: No    Physically Abused: No    Sexually Abused: No    FAMILY HISTORY: Family History  Problem Relation Age of Onset   Cancer Father    Congestive Heart Failure Mother    Colon cancer Neg Hx    Esophageal cancer Neg Hx    Rectal cancer Neg Hx    Stomach cancer Neg Hx     ALLERGIES:  is allergic to amoxicillin-pot clavulanate, sulfa antibiotics, erythromycin, and levofloxacin.  MEDICATIONS:  Current Outpatient Medications  Medication Sig Dispense Refill   acetaminophen (TYLENOL) 325 MG tablet Take 650 mg by mouth every 6 (six) hours as needed.     apixaban  (ELIQUIS ) 5 MG TABS tablet Take 1 tablet (5 mg total) by mouth 2 (two) times daily. 60 tablet 5   aspirin 81 MG tablet Take 81 mg by mouth daily. (Patient not taking: Reported on 01/26/2023)     clonazePAM  (KLONOPIN ) 1 MG tablet One tab by mouth at bedtime and one-half tab in am 45 tablet 2   dexlansoprazole  (DEXILANT ) 60 MG capsule TAKE 1 CAPSULE BY MOUTH EVERY DAY 90 capsule 1   hydrOXYzine  (VISTARIL ) 25 MG capsule Take 1 capsule (25 mg total) by mouth every 8 (eight) hours as needed. 30 capsule 2   Melatonin 5 MG TABS Take by mouth at bedtime.     meloxicam  (MOBIC ) 15 MG tablet Take 1 tablet (15 mg total) by mouth daily. 30  tablet 1   Multiple Vitamin (MULTIVITAMIN) capsule Take 1 capsule by mouth daily.     simvastatin  (ZOCOR ) 20 MG tablet Take 1 tablet (20 mg total) by mouth at bedtime. 90 tablet 3   No current facility-administered medications for this visit.     PHYSICAL EXAMINATION: ECOG PERFORMANCE STATUS: 0 - Asymptomatic  Vitals:   02/15/23 1417  BP: (!) 125/57  Pulse: 78  Resp: 16  Temp: 97.7 F (36.5 C)  SpO2: 98%   Filed Weights   02/15/23 1417  Weight: 187 lb 1.6 oz (84.9 kg)    GENERAL:alert, no distress and comfortable SKIN: skin color, texture, turgor are normal, no rashes or significant lesions EYES: normal, conjunctiva are pink and non-injected, sclera clear OROPHARYNX:no exudate, no erythema and lips, buccal mucosa, and tongue normal  NECK: supple, thyroid  normal size, non-tender, without nodularity LYMPH:  no palpable lymphadenopathy in the cervical, axillary or inguinal LUNGS: clear to auscultation and percussion with normal breathing effort HEART: regular rate & rhythm and no murmurs and no lower extremity edema ABDOMEN:abdomen soft, non-tender and normal bowel sounds Musculoskeletal:no cyanosis of digits and no clubbing  PSYCH: alert & oriented x 3 with fluent speech NEURO: no focal motor/sensory deficits  LABORATORY DATA:  I have reviewed the data as listed Lab Results  Component Value Date   WBC 6.1 12/14/2022   HGB 14.3 12/14/2022   HCT 42.7 12/14/2022   MCV 91.6 12/14/2022   PLT 260 12/14/2022     Chemistry      Component Value Date/Time   NA 141 12/14/2022 0924   K 5.3 12/14/2022 0924   CL 104 12/14/2022 0924   CO2 29 12/14/2022 0924   BUN 15 12/14/2022 0924   CREATININE 0.73 12/14/2022 0924      Component Value Date/Time   CALCIUM  9.8 12/14/2022 0924   ALKPHOS 56 10/25/2015 1014   AST 13 12/14/2022 0924   ALT 6 12/14/2022  9075   BILITOT 0.4 12/14/2022 0924       RADIOGRAPHIC STUDIES: I have personally reviewed the radiological images as  listed and agreed with the findings in the report. No results found.  All questions were answered. The patient knows to call the clinic with any problems, questions or concerns. I spent 45 minutes in the care of this patient including H and P, review of records, counseling and coordination of care.     Amber Stalls, MD 02/15/2023 5:01 PM

## 2023-02-16 LAB — PROTEIN C ACTIVITY: Protein C Activity: 117 % (ref 73–180)

## 2023-02-16 LAB — PROTEIN S ACTIVITY: Protein S Activity: 103 % (ref 63–140)

## 2023-02-17 LAB — CARDIOLIPIN ANTIBODIES, IGG, IGM, IGA
Anticardiolipin IgA: <9 [APL'U]/mL (ref 0–11)
Anticardiolipin IgG: <9 [GPL'U]/mL (ref 0–14)
Anticardiolipin IgM: <9 [MPL'U]/mL (ref 0–12)

## 2023-02-17 LAB — BETA-2-GLYCOPROTEIN I ABS, IGG/M/A
Beta-2 Glyco I IgG: <9 GPI IgG units (ref 0–20)
Beta-2-Glycoprotein I IgA: <9 GPI IgA units (ref 0–25)
Beta-2-Glycoprotein I IgM: <9 GPI IgM units (ref 0–32)

## 2023-02-17 LAB — DRVVT CONFIRM: dRVVT Confirm: 1 {ratio} (ref 0.8–1.2)

## 2023-02-17 LAB — DRVVT MIX: dRVVT Mix: 42.2 s — ABNORMAL HIGH (ref 0.0–40.4)

## 2023-02-17 LAB — LUPUS ANTICOAGULANT PANEL
DRVVT: 51 s — ABNORMAL HIGH (ref 0.0–47.0)
PTT Lupus Anticoagulant: 37 s (ref 0.0–43.5)

## 2023-02-23 ENCOUNTER — Other Ambulatory Visit: Payer: Self-pay

## 2023-02-23 DIAGNOSIS — Z122 Encounter for screening for malignant neoplasm of respiratory organs: Secondary | ICD-10-CM

## 2023-02-23 DIAGNOSIS — Z87891 Personal history of nicotine dependence: Secondary | ICD-10-CM

## 2023-02-23 DIAGNOSIS — F17210 Nicotine dependence, cigarettes, uncomplicated: Secondary | ICD-10-CM

## 2023-02-26 ENCOUNTER — Ambulatory Visit
Admission: RE | Admit: 2023-02-26 | Discharge: 2023-02-26 | Disposition: A | Discharge status: Home / Self Care | Payer: Medicare Other | Source: Ambulatory Visit | Attending: Internal Medicine | Admitting: Internal Medicine

## 2023-02-26 DIAGNOSIS — Z1231 Encounter for screening mammogram for malignant neoplasm of breast: Secondary | ICD-10-CM | POA: Diagnosis not present

## 2023-03-01 ENCOUNTER — Ambulatory Visit (HOSPITAL_BASED_OUTPATIENT_CLINIC_OR_DEPARTMENT_OTHER): Payer: Medicare Other

## 2023-03-05 ENCOUNTER — Ambulatory Visit (HOSPITAL_BASED_OUTPATIENT_CLINIC_OR_DEPARTMENT_OTHER)
Admission: RE | Admit: 2023-03-05 | Discharge: 2023-03-05 | Disposition: A | Discharge status: Home / Self Care | Payer: Medicare Other | Source: Ambulatory Visit | Attending: Internal Medicine | Admitting: Internal Medicine

## 2023-03-05 DIAGNOSIS — Z780 Asymptomatic menopausal state: Secondary | ICD-10-CM | POA: Diagnosis not present

## 2023-03-20 ENCOUNTER — Other Ambulatory Visit: Payer: Self-pay | Admitting: Internal Medicine

## 2023-04-23 ENCOUNTER — Telehealth: Payer: Self-pay | Admitting: Internal Medicine

## 2023-04-23 NOTE — Telephone Encounter (Signed)
 Patient called and said she has a knot on her nose on the bridge, She called her ENT to schedule but because she hasn't been there in so long she needs a new referral. Does she need an appointment with us ? And can it be virtual?

## 2023-05-03 ENCOUNTER — Ambulatory Visit (INDEPENDENT_AMBULATORY_CARE_PROVIDER_SITE_OTHER): Admitting: Internal Medicine

## 2023-05-03 VITALS — BP 124/74 | HR 88 | Temp 96.8°F | Ht 66.0 in | Wt 187.1 lb

## 2023-05-03 DIAGNOSIS — L739 Follicular disorder, unspecified: Secondary | ICD-10-CM

## 2023-05-03 DIAGNOSIS — F419 Anxiety disorder, unspecified: Secondary | ICD-10-CM

## 2023-05-03 DIAGNOSIS — F439 Reaction to severe stress, unspecified: Secondary | ICD-10-CM | POA: Diagnosis not present

## 2023-05-03 MED ORDER — HYDROCORTISONE 1 % EX CREA: 1.0000 | TOPICAL_CREAM | Freq: Two times a day (BID) | CUTANEOUS | 0 refills | Status: DC

## 2023-05-03 NOTE — Progress Notes (Signed)
 Patient Care Team: Anita Evener, MD as PCP - General (Internal Medicine) Anita Wagner as Physician Assistant (Dermatology)  Visit Date: 05/03/23  Subjective:   Chief Complaint  Patient presents with   Facial Injury    Right side has lump.  Patient ZO:XWRUEAV R Kataoka,Female DOB:06/18/1955,68 y.o. WUJ:[id number]   68 y.o. Female presents today for acute visit with Nasal Abnormality. She says that she isn't sure when it appeared, but noticed it after rubbing the bridge of her nose. Denies itching. Says that she does wear reading glasses, but not often.  Past Medical History:  Diagnosis Date   Anxiety    Diverticulitis    2016   DVT (deep venous thrombosis) (HCC)    calf- 2014   GERD (gastroesophageal reflux disease)    Seizures (HCC)    at age 81- blood sugar issue; none since    Allergies  Allergen Reactions   Amoxicillin-Pot Clavulanate Hives   Sulfa Antibiotics Rash   Erythromycin Nausea Only   Levofloxacin Nausea Only    Family History  Problem Relation Age of Onset   Congestive Heart Failure Mother    Cancer Father    Colon cancer Neg Hx    Esophageal cancer Neg Hx    Rectal cancer Neg Hx    Stomach cancer Neg Hx    Breast cancer Neg Hx    BRCA 1/2 Neg Hx    Social History   Social History Narrative   Not on file   Review of Systems  Skin:  Negative for itching.       (+) Raised Skin, Right   All other systems reviewed and are negative.    Objective:  Vitals: BP 124/74   Pulse 88   Temp (!) 96.8 F (36 C) (Temporal)   Ht 5\' 6"  (1.676 m)   Wt 187 lb 1.9 oz (84.9 kg)   SpO2 96%   BMI 30.20 kg/m   Physical Exam Vitals and nursing note reviewed.  Constitutional:      General: She is not in acute distress.    Appearance: Normal appearance. She is not toxic-appearing.  HENT:     Head: Normocephalic and atraumatic.     Nose: Nasal deformity (Area of Raised Skin on Right Side of Nasal Bridge) present. No nasal tenderness.  Pulmonary:      Effort: Pulmonary effort is normal.  Skin:    General: Skin is warm and dry.  Neurological:     Mental Status: She is alert and oriented to person, place, and time. Mental status is at baseline.  Psychiatric:        Mood and Affect: Mood normal.        Behavior: Behavior normal.        Thought Content: Thought content normal.        Judgment: Judgment normal.     Results:  Studies Obtained And Personally Reviewed By Me: Labs:     Component Value Date/Time   NA 141 12/14/2022 0924   K 5.3 12/14/2022 0924   CL 104 12/14/2022 0924   CO2 29 12/14/2022 0924   GLUCOSE 98 12/14/2022 0924   BUN 15 12/14/2022 0924   CREATININE 0.73 12/14/2022 0924   CALCIUM  9.8 12/14/2022 0924   PROT 7.6 12/14/2022 0924   ALBUMIN 3.9 10/25/2015 1014   AST 13 12/14/2022 0924   ALT 6 12/14/2022 0924   ALKPHOS 56 10/25/2015 1014   BILITOT 0.4 12/14/2022 0924   GFRNONAA 92 06/26/2019 1119  GFRAA 107 06/26/2019 1119    Lab Results  Component Value Date   WBC 6.1 12/14/2022   HGB 14.3 12/14/2022   HCT 42.7 12/14/2022   MCV 91.6 12/14/2022   PLT 260 12/14/2022   Lab Results  Component Value Date   CHOL 221 (H) 12/14/2022   HDL 69 12/14/2022   LDLCALC 133 (H) 12/14/2022   TRIG 91 12/14/2022   CHOLHDL 3.2 12/14/2022   Lab Results  Component Value Date   HGBA1C 6.0 (H) 12/14/2022    Lab Results  Component Value Date   TSH 1.75 12/14/2022    Assessment & Plan:  Folliculitis, Nasal Right-side: she isn't sure when it appeared, noticed it after rubbing the bridge of her nose. Denies itching. Says that she does wear reading glasses, but not often. Seems benign, reassured patient but instructed her to monitor the area and let us  know if there are any changes to the area. Sending in Hydrocortisone  cream applied to the area. Recommend applying a warm compress for about 20 minutes to the area once or twice daily   I,Emily Lagle,acting as a scribe for Anita Evener, MD.,have documented all  relevant documentation on the behalf of Anita Evener, MD,as directed by  Anita Evener, MD while in the presence of Anita Evener, MD.   I, Anita Evener, MD, have reviewed all documentation for this visit. The documentation on 05/03/23 for the exam, diagnosis, procedures, and orders are all accurate and complete.

## 2023-05-03 NOTE — Patient Instructions (Addendum)
 Patient will try 1% hydrocortisone  cream to right nasal bridge area twice daily for 1-2 weeks. If no improvement, refer to Dermatologist.

## 2023-05-04 ENCOUNTER — Encounter: Payer: Self-pay | Admitting: Internal Medicine

## 2023-05-30 NOTE — Progress Notes (Incomplete)
   Patient Care Team: Sylvan Evener, MD as PCP - General (Internal Medicine) [name] as Physician Assistant (Dermatology)  Visit Date: 05/30/23  Subjective:  No chief complaint on file.  There were no vitals filed for this visit. Patient Anita Wagner,Female DOB:June 17, 1955,68 y.o. ZDG:[id number]   68 y.o.Female presents today for 6 months follow-up for ***. Patient has a past medical history of ***.      Past Medical History:  Diagnosis Date   Anxiety    Diverticulitis    2016   DVT (deep venous thrombosis) (HCC)    calf- 2014   GERD (gastroesophageal reflux disease)    Seizures (HCC)    at age 71- blood sugar issue; none since    Allergies  Allergen Reactions   Amoxicillin-Pot Clavulanate Hives   Sulfa Antibiotics Rash   Erythromycin Nausea Only   Levofloxacin Nausea Only    Family History  Problem Relation Age of Onset   Congestive Heart Failure Mother    Cancer Father    Colon cancer Neg Hx    Esophageal cancer Neg Hx    Rectal cancer Neg Hx    Stomach cancer Neg Hx    Breast cancer Neg Hx    BRCA 1/2 Neg Hx    Social History   Social History Narrative   Not on file   ROS   Objective:  Vitals: There were no vitals taken for this visit.  Physical Exam  Results:  Studies Obtained And Personally Reviewed By Me:  {Imaging; Colonoscopy, Mammogram; DEXA; Echocardiogram; Heart Cath; Stress Test; CT Coronary Calcium  Scoring; etc:32292}  Labs:     Component Value Date/Time   NA 141 12/14/2022 0924   K 5.3 12/14/2022 0924   CL 104 12/14/2022 0924   CO2 29 12/14/2022 0924   GLUCOSE 98 12/14/2022 0924   BUN 15 12/14/2022 0924   CREATININE 0.73 12/14/2022 0924   CALCIUM  9.8 12/14/2022 0924   PROT 7.6 12/14/2022 0924   ALBUMIN 3.9 10/25/2015 1014   AST 13 12/14/2022 0924   ALT 6 12/14/2022 0924   ALKPHOS 56 10/25/2015 1014   BILITOT 0.4 12/14/2022 0924   GFRNONAA 92 06/26/2019 1119   GFRAA 107 06/26/2019 1119    Lab Results   Component Value Date   WBC 6.1 12/14/2022   HGB 14.3 12/14/2022   HCT 42.7 12/14/2022   MCV 91.6 12/14/2022   PLT 260 12/14/2022   Lab Results  Component Value Date   CHOL 221 (H) 12/14/2022   HDL 69 12/14/2022   LDLCALC 133 (H) 12/14/2022   TRIG 91 12/14/2022   CHOLHDL 3.2 12/14/2022   Lab Results  Component Value Date   HGBA1C 6.0 (H) 12/14/2022    Lab Results  Component Value Date   TSH 1.75 12/14/2022    {PSA (Optional):32132} No results found for any visits on 06/14/23. Assessment & Plan:   ***        I,Emily Lagle,acting as a scribe for Sylvan Evener, MD.,have documented all relevant documentation on the behalf of Sylvan Evener, MD,as directed by  Sylvan Evener, MD while in the presence of Sylvan Evener, MD.   ***

## 2023-06-04 ENCOUNTER — Other Ambulatory Visit: Payer: Self-pay | Admitting: Internal Medicine

## 2023-06-12 ENCOUNTER — Other Ambulatory Visit: Payer: Medicare Other

## 2023-06-14 ENCOUNTER — Ambulatory Visit: Payer: Medicare Other | Admitting: Internal Medicine

## 2023-08-28 ENCOUNTER — Other Ambulatory Visit

## 2023-08-28 DIAGNOSIS — E782 Mixed hyperlipidemia: Secondary | ICD-10-CM | POA: Diagnosis not present

## 2023-08-28 DIAGNOSIS — Z5181 Encounter for therapeutic drug level monitoring: Secondary | ICD-10-CM | POA: Diagnosis not present

## 2023-08-28 DIAGNOSIS — Z79899 Other long term (current) drug therapy: Secondary | ICD-10-CM | POA: Diagnosis not present

## 2023-08-28 LAB — HEPATIC FUNCTION PANEL
AG Ratio: 1.5 (calc) (ref 1.0–2.5)
ALT: 10 U/L (ref 6–29)
AST: 14 U/L (ref 10–35)
Albumin: 4.4 g/dL (ref 3.6–5.1)
Alkaline phosphatase (APISO): 66 U/L (ref 37–153)
Bilirubin, Direct: 0.1 mg/dL (ref 0.0–0.2)
Globulin: 3 g/dL (ref 1.9–3.7)
Indirect Bilirubin: 0.3 mg/dL (ref 0.2–1.2)
Total Bilirubin: 0.4 mg/dL (ref 0.2–1.2)
Total Protein: 7.4 g/dL (ref 6.1–8.1)

## 2023-08-28 LAB — LIPID PANEL
Cholesterol: 159 mg/dL (ref <200)
HDL: 61 mg/dL (ref >=50)
LDL Cholesterol (Calc): 79 mg/dL
Non-HDL Cholesterol (Calc): 98 mg/dL (ref <130)
Total CHOL/HDL Ratio: 2.6 (calc) (ref <5.0)
Triglycerides: 102 mg/dL (ref <150)

## 2023-08-29 ENCOUNTER — Ambulatory Visit: Payer: Self-pay | Admitting: Internal Medicine

## 2023-08-30 ENCOUNTER — Encounter: Payer: Self-pay | Admitting: Internal Medicine

## 2023-08-30 ENCOUNTER — Ambulatory Visit (INDEPENDENT_AMBULATORY_CARE_PROVIDER_SITE_OTHER): Admitting: Internal Medicine

## 2023-08-30 VITALS — BP 120/78 | HR 84 | Ht 66.0 in | Wt 189.0 lb

## 2023-08-30 DIAGNOSIS — Z86718 Personal history of other venous thrombosis and embolism: Secondary | ICD-10-CM | POA: Diagnosis not present

## 2023-08-30 DIAGNOSIS — R059 Cough, unspecified: Secondary | ICD-10-CM

## 2023-08-30 DIAGNOSIS — R7302 Impaired glucose tolerance (oral): Secondary | ICD-10-CM | POA: Diagnosis not present

## 2023-08-30 DIAGNOSIS — J432 Centrilobular emphysema: Secondary | ICD-10-CM

## 2023-08-30 DIAGNOSIS — K219 Gastro-esophageal reflux disease without esophagitis: Secondary | ICD-10-CM

## 2023-08-30 DIAGNOSIS — Z87891 Personal history of nicotine dependence: Secondary | ICD-10-CM | POA: Diagnosis not present

## 2023-08-30 DIAGNOSIS — Z8659 Personal history of other mental and behavioral disorders: Secondary | ICD-10-CM

## 2023-08-30 DIAGNOSIS — Z1211 Encounter for screening for malignant neoplasm of colon: Secondary | ICD-10-CM | POA: Diagnosis not present

## 2023-08-30 DIAGNOSIS — E782 Mixed hyperlipidemia: Secondary | ICD-10-CM

## 2023-08-30 DIAGNOSIS — F419 Anxiety disorder, unspecified: Secondary | ICD-10-CM

## 2023-08-30 DIAGNOSIS — Z6830 Body mass index (BMI) 30.0-30.9, adult: Secondary | ICD-10-CM

## 2023-08-30 MED ORDER — ALBUTEROL SULFATE HFA 108 (90 BASE) MCG/ACT IN AERS: 2.0000 | INHALATION_SPRAY | Freq: Four times a day (QID) | RESPIRATORY_TRACT | 0 refills | Status: DC | PRN

## 2023-08-30 NOTE — Patient Instructions (Addendum)
 Take pantoprazole  for cough possibly due to reflux. CPE booked for December. Labs are stable.

## 2023-08-30 NOTE — Progress Notes (Signed)
 Patient Care Team: Perri Ronal PARAS, MD as PCP - General (Internal Medicine) Porter Andrez SAUNDERS, PA-C (Inactive) as Physician Assistant (Dermatology)  Visit Date: 08/30/23  Subjective:   Chief Complaint  Patient presents with  . Hyperlipidemia   Patient PI:Anita Wagner,Female DOB:Oct 25, 1955,68 y.o. FMW:[id number]   68 y.o.Female presents today for follow-up for Hyperlipidemia. Patient has a past medical history of Anxiety; Hx of DVT; Smoking; GERD; Asthma. Seen for annual visit on 12/18/2022, in the interim has been seen at Pulmonology and Oncology.  She says that she's been coughing when laying down at night, described as a productive-like cough with the associated sensation of mucus coating her throat.  History of Hyperlipidemia treated with Simvastatin  20 mg daily. 08/28/2023 Lipid Panel: WNL. 2023 Coronary Calcium  Score: 0.  History of Impaired Glucose Tolerance with 12/14/2022 HgbA1c 6.0.   History of DVT 12/2013 after taking a long trip, being on Estrogen replacement, and smoking. Current smoker <1 PPD. 02/13/2023 Lung Cancer Screening determined benign behavior/appearance of pulmonary nodules.  History of Anxiety managed with Klonopin  0.5 mg morning and 1 mg at bedtime.  History of GERD managed with Dexilant  60 mg daily. She says that she usually takes this a couple of hours before going to bed.  Mild centrolobular emphysema-  treated with Albuterol  inhaler PRN.  Discussed Colonoscopy, and she would prefer to do Cologard. This was ordered.  Vaccine Counseling: Discussed, she is agreeable to completing Shingrix 2/2 and updating her Influenza in October, will consider Covid-19. Past Medical History:  Diagnosis Date  . Anxiety   . Diverticulitis    2016  . DVT (deep venous thrombosis) (HCC)    calf- 2014  . GERD (gastroesophageal reflux disease)   . Seizures (HCC)    at age 14- blood sugar issue; none since    Allergies  Allergen Reactions  . Amoxicillin-Pot  Clavulanate Hives  . Sulfa Antibiotics Rash  . Erythromycin Nausea Only  . Levofloxacin Nausea Only   Immunization History  Administered Date(s) Administered  . Fluad Quad(high Dose 65+) 11/10/2021, 11/09/2022  . Influenza Inj Mdck Quad Pf 09/28/2018  . Influenza Split 12/09/2010, 12/25/2011  . Influenza,inj,Quad PF,6+ Mos 10/02/2012, 10/28/2013, 10/26/2014, 10/29/2015, 09/21/2016, 09/20/2017, 10/27/2020  . Influenza-Unspecified 10/12/2019  . PFIZER(Purple Top)SARS-COV-2 Vaccination 04/07/2019, 04/21/2019, 10/28/2019, 11/22/2020, 11/10/2021  . PNEUMOCOCCAL CONJUGATE-20 12/22/2022  . Pfizer Covid-19 Vaccine Bivalent Booster 62yrs & up 10/01/2021  . Pneumococcal Conjugate-13 10/26/2014, 06/30/2019  . Pneumococcal Polysaccharide-23 06/30/2013  . Tdap 12/23/2001, 12/25/2011  . Zoster Recombinant(Shingrix) 02/09/2022   Past Surgical History:  Procedure Laterality Date  . ABDOMINAL HYSTERECTOMY     [place]  . BREAST BIOPSY     yrs ago  . ENDOMETRIAL ABLATION    . TONSILLECTOMY      Family History  Problem Relation Age of Onset  . Congestive Heart Failure Mother   . Cancer Father   . Colon cancer Neg Hx   . Esophageal cancer Neg Hx   . Rectal cancer Neg Hx   . Stomach cancer Neg Hx   . Breast cancer Neg Hx   . BRCA 1/2 Neg Hx    Social Hx: Retired from Therapist, sports. Married.  Review of Systems  Constitutional:  Negative for fever and malaise/fatigue.  HENT:  Negative for congestion.   Eyes:  Negative for blurred vision.  Respiratory:  Positive for cough. Negative for shortness of breath.   Cardiovascular:  Negative for chest pain, palpitations and leg swelling.  Gastrointestinal:  Negative for vomiting.  Musculoskeletal:  Negative for back pain.  Skin:  Negative for rash.  Neurological:  Negative for loss of consciousness and headaches.     Objective:  Vitals: BP 120/78   Pulse 84   Ht 5' 6 (1.676 m)   Wt 189 lb (85.7 kg)   SpO2 97%   BMI 30.51 kg/m    Physical Exam Vitals and nursing note reviewed.  Constitutional:      General: She is not in acute distress.    Appearance: Normal appearance. She is not toxic-appearing.  HENT:     Head: Normocephalic and atraumatic.  Cardiovascular:     Rate and Rhythm: Normal rate and regular rhythm. No extrasystoles are present.    Pulses: Normal pulses.     Heart sounds: Normal heart sounds. No murmur heard.    No friction rub. No gallop.  Pulmonary:     Effort: Pulmonary effort is normal. No respiratory distress.     Breath sounds: Normal breath sounds. No wheezing or rales.  Skin:    General: Skin is warm and dry.  Neurological:     Mental Status: She is alert and oriented to person, place, and time. Mental status is at baseline.  Psychiatric:        Mood and Affect: Mood normal.        Behavior: Behavior normal.        Thought Content: Thought content normal.        Judgment: Judgment normal.     Results:  Studies Obtained And Personally Reviewed By Me:  2023 Coronary Calcium  Score: 0.  02/13/2023 Lung Cancer Screening determined benign behavior/appearance of pulmonary nodules.  Labs:  CBC w/ Differential Lab Results  Component Value Date   WBC 6.1 12/14/2022   RBC 4.66 12/14/2022   HGB 14.3 12/14/2022   HCT 42.7 12/14/2022   PLT 260 12/14/2022   MCV 91.6 12/14/2022   MCH 30.7 12/14/2022   MCHC 33.5 12/14/2022   RDW 12.2 12/14/2022   MPV 10.3 12/14/2022   LYMPHSABS 1,428 07/03/2022   MONOABS 378 10/25/2015   BASOSABS 92 12/14/2022    Comprehensive Metabolic Panel Lab Results  Component Value Date   NA 141 12/14/2022   K 5.3 12/14/2022   CL 104 12/14/2022   CO2 29 12/14/2022   GLUCOSE 98 12/14/2022   BUN 15 12/14/2022   CREATININE 0.73 12/14/2022   CALCIUM  9.8 12/14/2022   PROT 7.4 08/28/2023   ALBUMIN 3.9 10/25/2015   AST 14 08/28/2023   ALT 10 08/28/2023   ALKPHOS 56 10/25/2015   BILITOT 0.4 08/28/2023   EGFR 90 12/14/2022   GFRNONAA 92 06/26/2019    Lipid Panel  Lab Results  Component Value Date   CHOL 159 08/28/2023   HDL 61 08/28/2023   LDLCALC 79 08/28/2023   TRIG 102 08/28/2023   A1c Lab Results  Component Value Date   HGBA1C 6.0 (H) 12/14/2022    TSH Lab Results  Component Value Date   TSH 1.75 12/14/2022   Hepatic Function Panel    Latest Ref Rng & Units 08/28/2023   12:00 AM  Hepatic Function  Total Protein 6.1 - 8.1 g/dL 7.4   AST 10 - 35 U/L 14   ALT 6 - 29 U/L 10   Total Bilirubin 0.2 - 1.2 mg/dL 0.4   Bilirubin, Direct 0.0 - 0.2 mg/dL 0.1    Assessment & Plan:   Orders Placed This Encounter  Procedures  . Cologuard  . Hemoglobin A1c  Meds ordered this encounter  Medications  . albuterol  (VENTOLIN  HFA) 108 (90 Base) MCG/ACT inhaler    Sig: Inhale 2 puffs into the lungs every 6 (six) hours as needed for wheezing or shortness of breath.    Dispense:  8 g    Refill:  0   Impaired Glucose Tolerance with 12/14/2022 HgbA1c 6.0.   Ordered HgbA1c per patient request, will inform her of results when available.  Hyperlipidemia treated with Simvastatin  20 mg daily. 08/28/2023 Lipid Panel: WNL. 2023 Coronary Calcium  Score: 0.  History of DVT 12/2013 after taking a long trip, being on Estrogen replacement, and smoking.   Current smoker <1 PPD. 02/13/2023 Lung Cancer Screening determined benign behavior/appearance of pulmonary nodules.  Anxiety managed with Klonopin  0.5 mg morning and 1 mg at bedtime.  GERD managed with Dexilant  60 mg daily. She says that she usually takes this a couple of hours before going to bed.  With her cough, I've recommended she start taking her Dexilant  in the mornings for all-day control to see if that improves her nightly cough.  Centrolobular emphysema- treated with Albuterol  inhaler PRN.   Cough reportedly when laying down at night, described as a productive-like cough with the associated sensation of mucus coating her throat.  With her hx of GERD, it is likely that silent  reflux is contributing to her cough, but she does also have hx of asthma. Refilling Albuterol  inhaler.   Discussed Colonoscopy, and she would prefer to do Colo-guard.  Vaccine Counseling: Discussed, she is agreeable to completing Shingrix 2/2 and updating her Influenza in October, will consider Covid-19.  Return in about 4 months (around 12/18/2023) for annual labs, and then on 12/24/2023 for annual visit, or as needed.   I,Emily Lagle,acting as a Neurosurgeon for Ronal JINNY Hailstone, MD.,have documented all relevant documentation on the behalf of Ronal JINNY Hailstone, MD,as directed by  Ronal JINNY Hailstone, MD while in the presence of Ronal JINNY Hailstone, MD.  I, Ronal JINNY Hailstone, MD, have reviewed all documentation for this visit. The documentation on 08/30/2023 for the exam, diagnosis, procedures, and orders are all accurate and complete.

## 2023-08-31 ENCOUNTER — Ambulatory Visit: Payer: Self-pay | Admitting: Internal Medicine

## 2023-08-31 LAB — HEMOGLOBIN A1C
Hgb A1c MFr Bld: 6 % — ABNORMAL HIGH (ref <5.7)
Mean Plasma Glucose: 126 mg/dL
eAG (mmol/L): 7 mmol/L

## 2023-09-15 LAB — COLOGUARD: COLOGUARD: NEGATIVE

## 2023-09-26 ENCOUNTER — Other Ambulatory Visit: Payer: Self-pay | Admitting: Internal Medicine

## 2023-10-11 ENCOUNTER — Telehealth: Payer: Self-pay | Admitting: Internal Medicine

## 2023-10-11 NOTE — Progress Notes (Addendum)
 Patient Care Team: Perri Ronal PARAS, MD as PCP - General (Internal Medicine) Porter Andrez SAUNDERS, PA-C (Inactive) as Physician Assistant (Dermatology)  Visit Date: 10/12/23  Subjective:    Patient ID: Anita Wagner , Female   DOB: 05-28-1955, 68 y.o.    MRN: [id number]   68 y.o. Female presents today for irritated tongue after taking antibiotics and steroids recently for a respiratory infection.  Patient has a past medical history of Anxiety, DVT, GE Reflux, and a long hx of smoking. No recent travel or known exposure to sick individuals.  Last seen on 08/30/2023 She said she was having a productive cough  when she laid down for bed. She was prescribed an albuterol  inhaler. In today's visit she said the albuterol  inhaler did not help to alleviate her cough and it is still persistent. She describes it as a loose cough and says she coughs up clear sputum. She notes that it gets worse at night when she lays down for bed. She has been measuring her oxygen saturation using a pulse oximeter at home and says her oxygen is consistently at 97%.   02/13/2023 CT lung cancer screening Mild centrilobular emphysema. Right middle lobe and lingular volume loss with scaring. Pulmonary nodules of Maximally 5.4 mm. 41 pack year hx of smoking and still smoking.  Vaccine counseling: Vaccines discussed, recommended to receive vaccinations once current illness has subsided.  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     Review of Systems  Respiratory:  Positive for cough and sputum production (clear sputum).         Objective:   Vitals: BP 130/70   Pulse 82   Ht 5' 6 (1.676 m)   Wt 189 lb (85.7 kg)   SpO2 97%   BMI 30.51 kg/m    Physical Exam Vitals and nursing note reviewed.  Constitutional:      General: She is not in acute distress.    Appearance: Normal appearance. She is not ill-appearing.  HENT:     Head: Normocephalic and atraumatic.     Right Ear: Tympanic membrane, ear canal and external ear normal.     Left Ear: Tympanic membrane, ear canal and external ear normal.     Ears:     Comments: Cerumen in right ear canal    Mouth/Throat:     Mouth: Mucous membranes are moist.     Pharynx: Oropharynx is clear. No oropharyngeal exudate or posterior oropharyngeal erythema.  Cardiovascular:     Rate and Rhythm: Normal rate and regular rhythm.     Heart sounds: Normal heart sounds.  Pulmonary:     Effort: Pulmonary effort is normal.     Breath sounds: Normal breath sounds. No wheezing, rhonchi or rales.  Lymphadenopathy:  Cervical: No cervical adenopathy.  Skin:    General: Skin is warm and dry.  Neurological:     Mental Status: She is alert and oriented to person, place, and time. Mental status is at baseline.  Psychiatric:        Mood and Affect: Mood normal.        Behavior: Behavior normal.        Thought Content: Thought content normal.        Judgment: Judgment normal.       Results:   Studies obtained and personally reviewed by me:   02/13/2023 CT lung cancer screening Mild centrilobular emphysema. Right middle lobe and lingular volume loss with scaring. Pulmonary nodules of Maximally 5.4 mm.   Labs:       Component Value Date/Time   NA 141 12/14/2022 0924   K 5.3 12/14/2022 0924   CL 104 12/14/2022 0924   CO2 29 12/14/2022 0924   GLUCOSE 98 12/14/2022 0924   BUN 15 12/14/2022 0924   CREATININE 0.73 12/14/2022 0924    CALCIUM  9.8 12/14/2022 0924   PROT 7.4 08/28/2023 0000   ALBUMIN 3.9 10/25/2015 1014   AST 14 08/28/2023 0000   ALT 10 08/28/2023 0000   ALKPHOS 56 10/25/2015 1014   BILITOT 0.4 08/28/2023 0000   GFRNONAA 92 06/26/2019 1119   GFRAA 107 06/26/2019 1119     Lab Results  Component Value Date   WBC 6.1 12/14/2022   HGB 14.3 12/14/2022   HCT 42.7 12/14/2022   MCV 91.6 12/14/2022   PLT 260 12/14/2022    Lab Results  Component Value Date   CHOL 159 08/28/2023   HDL 61 08/28/2023   LDLCALC 79 08/28/2023   TRIG 102 08/28/2023   CHOLHDL 2.6 08/28/2023    Lab Results  Component Value Date   HGBA1C 6.0 (H) 08/30/2023     Lab Results  Component Value Date   TSH 1.75 12/14/2022         Assessment & Plan:   Cough- lower respiratory infection- When Last seen on 08/30/2023, she said she was having a productive cough while lying in bed bed. She was prescribed an albuterol  inhaler.  Today she said the albuterol  inhaler did not help to alleviate her cough, and  cough is still presistent. She describes it as a productive cough and says she coughs up clear sputum. She notes that it gets worse a when she lies down at night. She has been measuring her oxygen saturation using a pulse oximeter at home and says her oxygen is consistently at 97%. Today was prescribed:   Rocephin 1 g IM injection received today.   Azithromycin  250 mg 2 tablets on day one, followed by 1 tablet  days 2-5; Symbicort oral inhaler 2 sprays twice a day, Diflucan  150 mg one time dose for Candida vaginitis if needed while on antibiotics. Medrol  dosepack 4 mg tapered course 6-5-4-3-2-1 prescribed.  If cough is not resolved in two weeks we will refer her to Pulmonologist for consultation.  Likely has COPD as she has hx of smoking and centrilobular emphysema noted on CT scan Feb 2025.  02/13/2023 CT lung cancer screening Mild centrilobular emphysema. Right middle lobe and lingular volume loss with scarring. Pulmonary nodules  of Maximally 5.4 mm.   Hx of GERD- currently not on PPI.  Current smoker- cessation counseling given.  Vaccine counseling: Vaccines discussed, recommended to receive vaccinations once current infection has subsided and off steroids.  I,Makayla C Reid,acting as a  scribe for Ronal JINNY Hailstone, MD.,have documented all relevant documentation on the behalf of Ronal JINNY Hailstone, MD,as directed by  Ronal JINNY Hailstone, MD while in the presence of Ronal JINNY Hailstone, MD.   I, Ronal JINNY Hailstone, MD, have reviewed all documentation for this visit. The documentation on 10/12/2023 for the exam, diagnosis, procedures, and orders are all accurate and complete.

## 2023-10-12 ENCOUNTER — Ambulatory Visit (INDEPENDENT_AMBULATORY_CARE_PROVIDER_SITE_OTHER): Admitting: Internal Medicine

## 2023-10-12 ENCOUNTER — Encounter: Payer: Self-pay | Admitting: Internal Medicine

## 2023-10-12 VITALS — BP 130/70 | HR 82 | Ht 66.0 in | Wt 189.0 lb

## 2023-10-12 DIAGNOSIS — F17200 Nicotine dependence, unspecified, uncomplicated: Secondary | ICD-10-CM | POA: Diagnosis not present

## 2023-10-12 DIAGNOSIS — J432 Centrilobular emphysema: Secondary | ICD-10-CM | POA: Diagnosis not present

## 2023-10-12 DIAGNOSIS — J22 Unspecified acute lower respiratory infection: Secondary | ICD-10-CM | POA: Diagnosis not present

## 2023-10-12 DIAGNOSIS — R918 Other nonspecific abnormal finding of lung field: Secondary | ICD-10-CM

## 2023-10-12 DIAGNOSIS — R059 Cough, unspecified: Secondary | ICD-10-CM

## 2023-10-12 DIAGNOSIS — K219 Gastro-esophageal reflux disease without esophagitis: Secondary | ICD-10-CM

## 2023-10-12 LAB — CBC WITH DIFFERENTIAL/PLATELET
Absolute Lymphocytes: 1693 {cells}/uL (ref 850–3900)
Absolute Monocytes: 415 {cells}/uL (ref 200–950)
Basophils Absolute: 88 {cells}/uL (ref 0–200)
Basophils Relative: 1.3 %
Eosinophils Absolute: 231 {cells}/uL (ref 15–500)
Eosinophils Relative: 3.4 %
HCT: 40.4 % (ref 35.0–45.0)
Hemoglobin: 13.5 g/dL (ref 11.7–15.5)
MCH: 30.7 pg (ref 27.0–33.0)
MCHC: 33.4 g/dL (ref 32.0–36.0)
MCV: 91.8 fL (ref 80.0–100.0)
MPV: 10.7 fL (ref 7.5–12.5)
Monocytes Relative: 6.1 %
Neutro Abs: 4372 {cells}/uL (ref 1500–7800)
Neutrophils Relative %: 64.3 %
Platelets: 268 Thousand/uL (ref 140–400)
RBC: 4.4 Million/uL (ref 3.80–5.10)
RDW: 12.2 % (ref 11.0–15.0)
Total Lymphocyte: 24.9 %
WBC: 6.8 Thousand/uL (ref 3.8–10.8)

## 2023-10-12 MED ORDER — BUDESONIDE-FORMOTEROL FUMARATE 160-4.5 MCG/ACT IN AERO: 2.0000 | INHALATION_SPRAY | Freq: Two times a day (BID) | RESPIRATORY_TRACT | 3 refills | Status: DC

## 2023-10-12 MED ORDER — METHYLPREDNISOLONE 4 MG PO TBPK: ORAL_TABLET | ORAL | 0 refills | Status: DC

## 2023-10-12 MED ORDER — CEFTRIAXONE SODIUM 1 G IJ SOLR
1.0000 g | Freq: Once | INTRAMUSCULAR | Status: AC
  Administered 2023-10-12: 1 g via INTRAMUSCULAR

## 2023-10-12 MED ORDER — FLUCONAZOLE 150 MG PO TABS: 150.0000 mg | ORAL_TABLET | Freq: Once | ORAL | 0 refills | Status: AC

## 2023-10-12 MED ORDER — AZITHROMYCIN 250 MG PO TABS
ORAL_TABLET | ORAL | 0 refills | Status: AC
Start: 2023-10-12 — End: 2023-10-17

## 2023-10-14 ENCOUNTER — Ambulatory Visit: Payer: Self-pay | Admitting: Internal Medicine

## 2023-10-16 ENCOUNTER — Other Ambulatory Visit: Payer: Self-pay | Admitting: Internal Medicine

## 2023-10-18 ENCOUNTER — Encounter: Payer: Self-pay | Admitting: Internal Medicine

## 2023-10-18 ENCOUNTER — Telehealth: Admitting: Internal Medicine

## 2023-10-18 ENCOUNTER — Telehealth: Payer: Self-pay | Admitting: Internal Medicine

## 2023-10-18 VITALS — Temp 97.7°F | Ht 66.0 in | Wt 189.0 lb

## 2023-10-18 DIAGNOSIS — F17210 Nicotine dependence, cigarettes, uncomplicated: Secondary | ICD-10-CM | POA: Diagnosis not present

## 2023-10-18 DIAGNOSIS — K121 Other forms of stomatitis: Secondary | ICD-10-CM

## 2023-10-18 DIAGNOSIS — F17200 Nicotine dependence, unspecified, uncomplicated: Secondary | ICD-10-CM

## 2023-10-18 DIAGNOSIS — J432 Centrilobular emphysema: Secondary | ICD-10-CM

## 2023-10-18 MED ORDER — LIDOCAINE VISCOUS HCL 2 % MT SOLN: OROMUCOSAL | 0 refills | Status: DC

## 2023-10-18 NOTE — Patient Instructions (Addendum)
 Patient is to use Magic Mouthwash 2 teaspoons swish but do not swallow 4 times daily for 5 -7 days. Advised to quit smoking.

## 2023-10-18 NOTE — Progress Notes (Signed)
 Please   Patient Care Team: Perri Ronal PARAS, MD as PCP - General (Internal Medicine) Porter Andrez SAUNDERS, PA-C (Inactive) as Physician Assistant (Dermatology)  I connected with Anita Wagner on 10/18/23 at 4:23 pm by video enabled telemedicine visit and verified that I am speaking with the correct person using two identifiers.   I discussed the limitations, risks, security and privacy concerns of performing an evaluation and management service by telemedicine and the availability of in-person appointments. I also discussed with the patient that there may be a patient responsible charge related to this service. The patient expressed understanding and agreed to proceed.   Other persons participating in the visit and their role in the encounter: Medical scribe, Nestora JAYSON Bathe  Patient's location: Home  Provider's location: Clinic   I provided 12 minutes of face-to-face video visit time during this encounter, and > 50% was spent counseling as documented under my assessment & plan. Time was also spent with chart review, medical decision making and e-scribing medication records as well.  Chief Complaint: Tongue redness   Subjective:    Patient ID: Anita Wagner , Female    DOB: 05/06/1955, 68 y.o.    MRN: [id number]   67 y.o. Female presents today for tongue redness. Patient has a past medical history of Anxiety, DVT, GE Reflux, Asthma .  On 10/12/2023 She presented to the office because she had a productive cough at nighttime that was not relieved by an albuterol  inhaler.  She received a Rocephin 1 g IM injection that day and  given Rx for zithromax  z pak, Symbicort  inhaler,and  Medrol  dose pak. Today she presents with tongue redness. She say that it started about 2-3 days ago and that it also hurts to swallow. She was informed it was likely related to taking antibiotics and steroids at one time (stomatitis)    Past Medical History:  Diagnosis Date   Anxiety    Diverticulitis    2016    DVT (deep venous thrombosis) (HCC)    calf- 2014   GERD (gastroesophageal reflux disease)    Seizures (HCC)    at age 39- blood sugar issue; none since     Family History  Problem Relation Age of Onset   Congestive Heart Failure Mother    Cancer Father    Colon cancer Neg Hx    Esophageal cancer Neg Hx    Rectal cancer Neg Hx    Stomach cancer Neg Hx    Breast cancer Neg Hx    BRCA 1/2 Neg Hx         Review of Systems  HENT:         Tongue redness and dysphagia  All other systems reviewed and are negative.       Objective:   Vitals: Ht 5' 6 (1.676 m)   Wt 189 lb (85.7 kg)   BMI 30.51 kg/m    Physical Exam Vitals and nursing note reviewed.     Seen virtually in NAD. Symptoms discussed. Unable to visualize mouth on video visit but sxs are c/w stomatitis  Results:     Labs:       Component Value Date/Time   NA 141 12/14/2022 0924   K 5.3 12/14/2022 0924   CL 104 12/14/2022 0924   CO2 29 12/14/2022 0924   GLUCOSE 98 12/14/2022 0924   BUN 15 12/14/2022 0924   CREATININE 0.73 12/14/2022 0924   CALCIUM  9.8 12/14/2022 0924   PROT 7.4 08/28/2023 0000  ALBUMIN 3.9 10/25/2015 1014   AST 14 08/28/2023 0000   ALT 10 08/28/2023 0000   ALKPHOS 56 10/25/2015 1014   BILITOT 0.4 08/28/2023 0000   GFRNONAA 92 06/26/2019 1119   GFRAA 107 06/26/2019 1119     Lab Results  Component Value Date   WBC 6.8 10/12/2023   HGB 13.5 10/12/2023   HCT 40.4 10/12/2023   MCV 91.8 10/12/2023   PLT 268 10/12/2023    Lab Results  Component Value Date   CHOL 159 08/28/2023   HDL 61 08/28/2023   LDLCALC 79 08/28/2023   TRIG 102 08/28/2023   CHOLHDL 2.6 08/28/2023    Lab Results  Component Value Date   HGBA1C 6.0 (H) 08/30/2023     Lab Results  Component Value Date   TSH 1.75 12/14/2022         Assessment & Plan:   Stomatitis: On 10/12/2023 She presented to the office because she had a productive cough at nighttime that was not relieved by an  albuterol  inhaler.  She received a Rocephin 1 g IM injection that day and prescription  Aithromycin 250 mg, Symbicort and a Medrol  dose pack. Today she presents with tongue redness. She say that it started about 2-3 days ago and that it also hurts to swallow. She was informed it was a possible side effect of taking azithromycin  and the Medrol  dosepak.    Magic mouthwash 2 teaspoons by mouth, swish do not swallow, 4 times a day prescribed for 5-7 days  Current smoker- has been advised to quit. Have offered assistance with smoking cessation   Exacerbation of COPD/ acute lower respiratory infection treated aggressively with antibiotics and steroids getting on October 3rd causing stomatitis.  I,Makayla C Reid,acting as a scribe for Ronal JINNY Hailstone, MD.,have documented all relevant documentation on the behalf of Ronal JINNY Hailstone, MD,as directed by  Ronal JINNY Hailstone, MD while in the presence of Ronal JINNY Hailstone, MD.

## 2023-10-18 NOTE — Telephone Encounter (Signed)
 Would you like for us  to book a video visit to discuss?

## 2023-10-21 NOTE — Patient Instructions (Signed)
 Patient needs to quit smoking. Has hx of empnysema on lung caner screening CT Feb 2025. Use Symbicoert inhaoer regularly. Take Medrol  dose pack as prescribed and Zithromax  Z-pak.Consider referral to Pulmonary if not improved in 2 weeks.

## 2023-10-28 ENCOUNTER — Encounter: Payer: Self-pay | Admitting: Internal Medicine

## 2023-12-11 ENCOUNTER — Other Ambulatory Visit: Payer: Self-pay | Admitting: Internal Medicine

## 2023-12-11 NOTE — Progress Notes (Signed)
 Annual Wellness Visit   Patient Care Team: Jacolyn Joaquin, Ronal PARAS, MD as PCP - General (Internal Medicine) Porter Andrez SAUNDERS, PA-C (Inactive) as Physician Assistant (Dermatology)  Visit Date: 12/24/2023   Chief Complaint  Patient presents with   Annual Exam   Medicare Wellness   Subjective:  Patient: Anita Wagner, Female DOB: April 11, 1955, 68 y.o. MRN: [id number] Vitals:   12/24/23 1402  BP: 120/70   Anita Wagner is a 68 y.o. Female who presents today for her Annual Wellness Visit. Patient has Anxiety; GERD (gastroesophageal reflux disease); Allergic rhinitis; History of smoking; History of DVT (deep vein thrombosis); Hot flashes; Obesity; and History of diverticulitis on their problem list.  History of Hyperlipidemia treated with Simvastatin  20 mg daily. 2023 Coronary Calcium  Score: 0. 12/21/2023 Lipid panel normal.   History of Impaired Glucose Tolerance with 12/21/2023 HgbA1c 5.8%. She said that she hasn't been eating the healthiest.     History of DVT 12/2013 after taking a long trip, being on Estrogen replacement, and smoking. Current smoker <1 PPD. 02/13/2023 Lung Cancer Screening determined benign behavior/appearance of pulmonary nodules.   History of Anxiety managed with Klonopin  0.5 mg morning and 1 mg at bedtime.   History of GERD managed with Dexilant  60 mg daily.     Mild centrolobular emphysema, COPD Treated with Ventolin  and Symbicort  inhalers.     2016 colonoscopy showed mild diverticulosis in left colon.    Multiple drug intolerances including Augmentin, amoxicillin, sulfa, Levaquin.  Intolerant of erythromycin but can take Zithromax .  History of allergic rhinitis which is longstanding.  History of eosinophilia.  History of sensorineural hearing loss.  Benign left breast needle biopsy 2004.  History of Morton's neuroma right foot and had injections in 2021 from podiatrist.  Labs 12/21/2023  HgbA1c 5.8%, Otherwise WNL.   02/26/2023 Mammogram No  mammographic evidence of malignancy. Repeat in one year.     Negative cologuard on 09/11/2023   12/15/2014  Colonoscopy Sessile polyp was found in ascending colon. Mild diverticulosis was noted in left colon. The examination was otherwise normal.   Vaccine counseling: Influenza vaccine received today. Tetanus and Covid-19 vaccines due.   Health Maintenance  Topic Date Due   DTaP/Tdap/Td (3 - Td or Tdap) 12/24/2021   Influenza Vaccine  08/10/2023   COVID-19 Vaccine (7 - 2025-26 season) 09/10/2023   Medicare Annual Wellness (AWV)  12/18/2023   Colonoscopy  12/23/2024 (Originally 12/15/2019)   Lung Cancer Screening  02/13/2024   Mammogram  02/26/2024   Pneumococcal Vaccine: 50+ Years  Completed   Bone Density Scan  Completed   Zoster Vaccines- Shingrix  Completed   Meningococcal B Vaccine  Aged Out   Hepatitis C Screening  Discontinued    Review of Systems  Constitutional:  Negative for fever and malaise/fatigue.  HENT:  Negative for congestion.   Eyes:  Negative for blurred vision.  Respiratory:  Negative for cough and shortness of breath.   Cardiovascular:  Negative for chest pain, palpitations and leg swelling.  Gastrointestinal:  Negative for vomiting.  Musculoskeletal:  Negative for back pain.  Skin:  Negative for rash.  Neurological:  Negative for loss of consciousness and headaches.   Objective:  Vitals: body mass index is 30.99 kg/m. Today's Vitals   12/24/23 1402  BP: 120/70  Pulse: 90  SpO2: 97%  Weight: 192 lb (87.1 kg)  Height: 5' 6 (1.676 m)  PainSc: 0-No pain   Physical Exam Vitals and nursing note reviewed.  Constitutional:  General: She is not in acute distress.    Appearance: Normal appearance. She is not ill-appearing or toxic-appearing.  HENT:     Head: Normocephalic and atraumatic.     Right Ear: Hearing, tympanic membrane, ear canal and external ear normal.     Left Ear: Hearing, tympanic membrane, ear canal and external ear normal.      Mouth/Throat:     Pharynx: Oropharynx is clear.  Eyes:     Extraocular Movements: Extraocular movements intact.     Pupils: Pupils are equal, round, and reactive to light.  Neck:     Thyroid : No thyroid  mass, thyromegaly or thyroid  tenderness.     Vascular: No carotid bruit.  Cardiovascular:     Rate and Rhythm: Normal rate and regular rhythm. No extrasystoles are present.    Pulses:          Dorsalis pedis pulses are 2+ on the right side and 2+ on the left side.     Heart sounds: Normal heart sounds. No murmur heard.    No friction rub. No gallop.  Pulmonary:     Effort: Pulmonary effort is normal.     Breath sounds: Normal breath sounds. No decreased breath sounds, wheezing, rhonchi or rales.  Chest:     Chest wall: No mass.  Abdominal:     Palpations: Abdomen is soft. There is no hepatomegaly, splenomegaly or mass.     Tenderness: There is no abdominal tenderness.     Hernia: No hernia is present.  Genitourinary:    Comments: Pelvic exam deferred due to hysterectomy.  Musculoskeletal:     Cervical back: Normal range of motion.     Right lower leg: No edema.     Left lower leg: No edema.  Lymphadenopathy:     Cervical: No cervical adenopathy.     Upper Body:     Right upper body: No supraclavicular adenopathy.     Left upper body: No supraclavicular adenopathy.  Skin:    General: Skin is warm and dry.  Neurological:     General: No focal deficit present.     Mental Status: She is alert and oriented to person, place, and time. Mental status is at baseline.     Sensory: Sensation is intact.     Motor: Motor function is intact. No weakness.     Deep Tendon Reflexes: Reflexes are normal and symmetric.  Psychiatric:        Attention and Perception: Attention normal.        Mood and Affect: Mood normal.        Speech: Speech normal.        Behavior: Behavior normal.        Thought Content: Thought content normal.        Cognition and Memory: Cognition normal.         Judgment: Judgment normal.     Current Outpatient Medications  Medication Instructions   acetaminophen (TYLENOL) 650 mg, Every 6 hours PRN   aspirin 81 mg, Daily   budesonide -formoterol  (SYMBICORT ) 160-4.5 MCG/ACT inhaler 2 puffs, Inhalation, 2 times daily   clonazePAM  (KLONOPIN ) 1 MG tablet TAKE 1/2 TABLET IN THE MORNING AND 1 TABLET AT BEDTIME   Multiple Vitamin (MULTIVITAMIN) capsule 1 capsule, Daily   simvastatin  (ZOCOR ) 20 mg, Oral, Daily at bedtime   Past Medical History:  Diagnosis Date   Anxiety    Diverticulitis    2016   DVT (deep venous thrombosis) (HCC)    calf- 2014  GERD (gastroesophageal reflux disease)    Seizures (HCC)    at age 63- blood sugar issue; none since   Medical/Surgical History Narrative:  Allergic/Intolerant to:  Allergies  Allergen Reactions   Amoxicillin-Pot Clavulanate Hives   Sulfa Antibiotics Rash   Erythromycin Nausea Only   Levofloxacin Nausea Only   Past Surgical History:  Procedure Laterality Date   ABDOMINAL HYSTERECTOMY     tah bso   BREAST BIOPSY     yrs ago   ENDOMETRIAL ABLATION     TONSILLECTOMY     Family History  Problem Relation Age of Onset   Congestive Heart Failure Mother    Cancer Father    Colon cancer Neg Hx    Esophageal cancer Neg Hx    Rectal cancer Neg Hx    Stomach cancer Neg Hx    Breast cancer Neg Hx    BRCA 1/2 Neg Hx    Family History Narrative: Family history: Mother passed away with history of dementia, congestive heart failure and arthritis. Father died in 32 with lung cancer. 1 brother with history of bipolar disorder.   Social history: She has a high school education and retired as a tour manager. Husband is a retired teacher, adult education. Has smoked for well over 20 years. 1 glass of wine daily. This is her second marriage. No children. First marriage ended in divorce.  Most Recent Health Risks Assessment:   Most Recent Social Determinants of Health (Including Hx of Tobacco,  Alcohol, and Drug Use) SDOH Screenings   Food Insecurity: No Food Insecurity (12/20/2023)  Housing: Unknown (12/20/2023)  Transportation Needs: No Transportation Needs (12/20/2023)  Utilities: Not At Risk (08/30/2023)  Alcohol Screen: Low Risk (08/30/2023)  Depression (PHQ2-9): Low Risk (08/30/2023)  Financial Resource Strain: Low Risk (12/20/2023)  Physical Activity: Insufficiently Active (12/20/2023)  Social Connections: Socially Isolated (12/20/2023)  Stress: No Stress Concern Present (12/20/2023)  Tobacco Use: High Risk (12/24/2023)  Health Literacy: Adequate Health Literacy (08/30/2023)   Social History   Tobacco Use   Smoking status: Every Day    Current packs/day: 1.00    Average packs/day: 1 pack/day for 40.0 years (40.0 ttl pk-yrs)    Types: Cigarettes   Smokeless tobacco: Never  Substance Use Topics   Alcohol use: Yes    Alcohol/week: 5.0 standard drinks of alcohol    Types: 5 Glasses of wine per week    Comment: daily- 1-2 glasses   Drug use: No   Most Recent Functional Status Assessment:    12/20/2023    9:44 AM  In your present state of health, do you have any difficulty performing the following activities:  Hearing? 0   Vision? 0      Manually entered by patient   Most Recent Fall Risk Assessment:    12/24/2023    1:52 PM  Fall Risk   Falls in the past year? 0  Number falls in past yr: 1  Injury with Fall? 0  Risk for fall due to : No Fall Risks   Most Recent Anxiety/Depression Screenings:    08/30/2023    9:20 AM 05/03/2023    3:18 PM  PHQ 2/9 Scores  PHQ - 2 Score 0 0    Most Recent Cognitive Screening:    12/24/2023    1:52 PM  6CIT Screen  What Year? 0 points  What month? 0 points  What time? 0 points  Count back from 20 0 points  Months in reverse 0 points  Repeat phrase 0  points  Total Score 0 points    Results:  Studies Obtained And Personally Reviewed By Me:  02/26/2023 Mammogram No mammographic evidence of malignancy.  Repeat in one year.     Negative cologuard on 09/11/2023   12/15/2014 Colonoscopy Sessile polyp was found in ascending colon. Mild diverticulosis was noted in left colon. The examination was otherwise normal.    Labs:  CBC w/ Differential Lab Results  Component Value Date   WBC 6.2 12/21/2023   RBC 4.42 12/21/2023   HGB 13.1 12/21/2023   HCT 40.3 12/21/2023   PLT 250 12/21/2023   MCV 91.2 12/21/2023   MCH 29.6 12/21/2023   MCHC 32.5 12/21/2023   RDW 12.7 12/21/2023   MPV 10.5 12/21/2023   LYMPHSABS 1,428 07/03/2022   MONOABS 378 10/25/2015   BASOSABS 87 12/21/2023    Comprehensive Metabolic Panel Lab Results  Component Value Date   NA 138 12/21/2023   K 4.1 12/21/2023   CL 106 12/21/2023   CO2 24 12/21/2023   GLUCOSE 89 12/21/2023   BUN 12 12/21/2023   CREATININE 0.62 12/21/2023   CALCIUM  9.0 12/21/2023   PROT 6.7 12/21/2023   ALBUMIN 3.9 10/25/2015   AST 14 12/21/2023   ALT 8 12/21/2023   ALKPHOS 56 10/25/2015   BILITOT 0.4 12/21/2023   EGFR 96 12/21/2023   GFRNONAA 92 06/26/2019   Lipid Panel  Lab Results  Component Value Date   CHOL 150 12/21/2023   HDL 63 12/21/2023   LDLCALC 68 12/21/2023   TRIG 102 12/21/2023   A1c Lab Results  Component Value Date   HGBA1C 5.8 (H) 12/21/2023    TSH Lab Results  Component Value Date   TSH 1.87 12/21/2023   Assessment & Plan:   Hyperlipidemia: treated with Simvastatin  20 mg daily. 2023 Coronary Calcium  Score: 0. 12/21/2023 Lipid panel normal.   Impaired Glucose Tolerance: with 12/21/2023 HgbA1c 5.8%. She said that she hasn't been eating the healthiest.     History of DVT 12/2013 after taking a long trip, being on Estrogen replacement, and smoking. Current smoker <1 PPD. 02/13/2023 Lung Cancer Screening determined benign behavior/appearance of pulmonary nodules.   Anxiety: managed with Klonopin  0.5 mg morning and 1 mg at bedtime.   GE Reflux: managed with Dexilant  60 mg daily.     Mild centrolobular  emphysema, COPD: Treated with Ventolin  and Symbicort  inhalers.    History of sensorineural hearing loss.  02/26/2023 Mammogram No mammographic evidence of malignancy. Repeat in one year.     Negative cologuard on 09/11/2023   12/15/2014 Colonoscopy Sessile polyp was found in ascending colon. Mild diverticulosis was noted in left colon. The examination was otherwise normal.   Vaccine counseling: Influenza vaccine received today. Tetanus and Covid-19 vaccines due.     Annual Wellness Visit done today including the all of the following: Reviewed patient's Family Medical History Reviewed patient's SDOH and reviewed tobacco, alcohol, and drug use.  Reviewed and updated list of patient's medical providers Assessment of cognitive impairment was done Assessed patient's functional ability Established a written schedule for health screening services Health Risk Assessent Completed and Reviewed  Discussed health benefits of physical activity, and encouraged her to engage in regular exercise appropriate for her age and condition.    I,Makayla C Reid,acting as a scribe for Ronal JINNY Hailstone, MD.,have documented all relevant documentation on the behalf of Ronal JINNY Hailstone, MD,as directed by  Ronal JINNY Hailstone, MD while in the presence of Ronal JINNY Hailstone, MD.  LILLETTE Ronal  JINNY Hailstone, MD, have reviewed all documentation for and agree with the above Annual Wellness Visit documentation.  Ronal JINNY Hailstone, MD Internal Medicine 12/24/2023

## 2023-12-17 ENCOUNTER — Telehealth: Payer: Self-pay | Admitting: Internal Medicine

## 2023-12-17 ENCOUNTER — Other Ambulatory Visit: Payer: Self-pay

## 2023-12-17 DIAGNOSIS — E782 Mixed hyperlipidemia: Secondary | ICD-10-CM

## 2023-12-17 DIAGNOSIS — R7302 Impaired glucose tolerance (oral): Secondary | ICD-10-CM

## 2023-12-17 DIAGNOSIS — K219 Gastro-esophageal reflux disease without esophagitis: Secondary | ICD-10-CM

## 2023-12-17 DIAGNOSIS — Z0000 Encounter for general adult medical examination without abnormal findings: Secondary | ICD-10-CM

## 2023-12-18 ENCOUNTER — Other Ambulatory Visit: Payer: Self-pay

## 2023-12-21 ENCOUNTER — Other Ambulatory Visit

## 2023-12-21 DIAGNOSIS — R7302 Impaired glucose tolerance (oral): Secondary | ICD-10-CM

## 2023-12-21 DIAGNOSIS — Z0000 Encounter for general adult medical examination without abnormal findings: Secondary | ICD-10-CM

## 2023-12-21 DIAGNOSIS — Z1329 Encounter for screening for other suspected endocrine disorder: Secondary | ICD-10-CM

## 2023-12-21 DIAGNOSIS — E782 Mixed hyperlipidemia: Secondary | ICD-10-CM

## 2023-12-21 NOTE — Telephone Encounter (Signed)
 done

## 2023-12-22 LAB — CBC WITH DIFFERENTIAL/PLATELET
Absolute Lymphocytes: 1631 {cells}/uL (ref 850–3900)
Absolute Monocytes: 378 {cells}/uL (ref 200–950)
Basophils Absolute: 87 {cells}/uL (ref 0–200)
Basophils Relative: 1.4 %
Eosinophils Absolute: 229 {cells}/uL (ref 15–500)
Eosinophils Relative: 3.7 %
HCT: 40.3 % (ref 35.9–46.0)
Hemoglobin: 13.1 g/dL (ref 11.7–15.5)
MCH: 29.6 pg (ref 27.0–33.0)
MCHC: 32.5 g/dL (ref 31.6–35.4)
MCV: 91.2 fL (ref 81.4–101.7)
MPV: 10.5 fL (ref 7.5–12.5)
Monocytes Relative: 6.1 %
Neutro Abs: 3875 {cells}/uL (ref 1500–7800)
Neutrophils Relative %: 62.5 %
Platelets: 250 Thousand/uL (ref 140–400)
RBC: 4.42 Million/uL (ref 3.80–5.10)
RDW: 12.7 % (ref 11.0–15.0)
Total Lymphocyte: 26.3 %
WBC: 6.2 Thousand/uL (ref 3.8–10.8)

## 2023-12-22 LAB — COMPREHENSIVE METABOLIC PANEL WITH GFR
AG Ratio: 1.6 (calc) (ref 1.0–2.5)
ALT: 8 U/L (ref 6–29)
AST: 14 U/L (ref 10–35)
Albumin: 4.1 g/dL (ref 3.6–5.1)
Alkaline phosphatase (APISO): 62 U/L (ref 37–153)
BUN: 12 mg/dL (ref 7–25)
CO2: 24 mmol/L (ref 20–32)
Calcium: 9 mg/dL (ref 8.6–10.4)
Chloride: 106 mmol/L (ref 98–110)
Creat: 0.62 mg/dL (ref 0.50–1.05)
Globulin: 2.6 g/dL (ref 1.9–3.7)
Glucose, Bld: 89 mg/dL (ref 65–99)
Potassium: 4.1 mmol/L (ref 3.5–5.3)
Sodium: 138 mmol/L (ref 135–146)
Total Bilirubin: 0.4 mg/dL (ref 0.2–1.2)
Total Protein: 6.7 g/dL (ref 6.1–8.1)
eGFR: 96 mL/min/1.73m2 (ref >=60)

## 2023-12-22 LAB — LIPID PANEL
Cholesterol: 150 mg/dL (ref <200)
HDL: 63 mg/dL (ref >=50)
LDL Cholesterol (Calc): 68 mg/dL
Non-HDL Cholesterol (Calc): 87 mg/dL (ref <130)
Total CHOL/HDL Ratio: 2.4 (calc) (ref <5.0)
Triglycerides: 102 mg/dL (ref <150)

## 2023-12-22 LAB — HEMOGLOBIN A1C
Hgb A1c MFr Bld: 5.8 % — ABNORMAL HIGH (ref <5.7)
Mean Plasma Glucose: 120 mg/dL
eAG (mmol/L): 6.6 mmol/L

## 2023-12-22 LAB — TSH: TSH: 1.87 m[IU]/L (ref 0.40–4.50)

## 2023-12-24 ENCOUNTER — Encounter: Payer: Self-pay | Admitting: Internal Medicine

## 2023-12-24 ENCOUNTER — Ambulatory Visit: Payer: Self-pay | Admitting: Internal Medicine

## 2023-12-24 VITALS — BP 120/70 | HR 90 | Ht 66.0 in | Wt 192.0 lb

## 2023-12-24 DIAGNOSIS — F419 Anxiety disorder, unspecified: Secondary | ICD-10-CM

## 2023-12-24 DIAGNOSIS — J432 Centrilobular emphysema: Secondary | ICD-10-CM | POA: Diagnosis not present

## 2023-12-24 DIAGNOSIS — Z23 Encounter for immunization: Secondary | ICD-10-CM

## 2023-12-24 DIAGNOSIS — Z0000 Encounter for general adult medical examination without abnormal findings: Secondary | ICD-10-CM

## 2023-12-24 DIAGNOSIS — F17200 Nicotine dependence, unspecified, uncomplicated: Secondary | ICD-10-CM | POA: Diagnosis not present

## 2023-12-24 DIAGNOSIS — E782 Mixed hyperlipidemia: Secondary | ICD-10-CM

## 2023-12-24 DIAGNOSIS — K219 Gastro-esophageal reflux disease without esophagitis: Secondary | ICD-10-CM | POA: Diagnosis not present

## 2023-12-24 DIAGNOSIS — R7302 Impaired glucose tolerance (oral): Secondary | ICD-10-CM | POA: Diagnosis not present

## 2023-12-24 DIAGNOSIS — E785 Hyperlipidemia, unspecified: Secondary | ICD-10-CM

## 2023-12-24 DIAGNOSIS — Z86718 Personal history of other venous thrombosis and embolism: Secondary | ICD-10-CM | POA: Diagnosis not present

## 2023-12-24 LAB — POCT URINALYSIS DIP (CLINITEK)
Bilirubin, UA: NEGATIVE
Blood, UA: NEGATIVE
Glucose, UA: NEGATIVE mg/dL
Ketones, POC UA: NEGATIVE mg/dL
Leukocytes, UA: NEGATIVE
Nitrite, UA: NEGATIVE
POC PROTEIN,UA: NEGATIVE
Spec Grav, UA: 1.01 (ref 1.010–1.025)
Urobilinogen, UA: 0.2 U/dL
pH, UA: 6 (ref 5.0–8.0)

## 2023-12-24 NOTE — Telephone Encounter (Signed)
 Done

## 2023-12-24 NOTE — Patient Instructions (Signed)
 It was a pleasure to see you today. Continue current meds and return in 6 months or as needed. Anita Wagner,  Thank you for taking the time for your Medicare Wellness Visit. I appreciate your continued commitment to your health goals. Please review the care plan we discussed, and feel free to reach out if I can assist you further.  Please note that Annual Wellness Visits do not include a physical exam. Some assessments may be limited, especially if the visit was conducted virtually. If needed, we may recommend an in-person follow-up with your provider.  Ongoing Care Seeing your primary care provider every 3 to 6 months helps us  monitor your health and provide consistent, personalized care.   Referrals If a referral was made during today's visit and you haven't received any updates within two weeks, please contact the referred provider directly to check on the status.  Recommended Screenings:  Health Maintenance  Topic Date Due   DTaP/Tdap/Td vaccine (3 - Td or Tdap) 12/24/2021   COVID-19 Vaccine (7 - 2025-26 season) 09/10/2023   Medicare Annual Wellness Visit  12/18/2023   Colon Cancer Screening  12/23/2024*   Screening for Lung Cancer  02/13/2024   Breast Cancer Screening  02/26/2024   Pneumococcal Vaccine for age over 3  Completed   Flu Shot  Completed   Osteoporosis screening with Bone Density Scan  Completed   Zoster (Shingles) Vaccine  Completed   Meningitis B Vaccine  Aged Out   Hepatitis C Screening  Discontinued  *Topic was postponed. The date shown is not the original due date.       12/24/2023    1:52 PM  Advanced Directives  Does Patient Have a Medical Advance Directive? Yes  Type of Advance Directive Living will;Healthcare Power of Attorney  Does patient want to make changes to medical advance directive? No - Patient declined  Copy of Healthcare Power of Attorney in Chart? No - copy requested    Vision: Annual vision screenings are recommended for early detection  of glaucoma, cataracts, and diabetic retinopathy. These exams can also reveal signs of chronic conditions such as diabetes and high blood pressure.  Dental: Annual dental screenings help detect early signs of oral cancer, gum disease, and other conditions linked to overall health, including heart disease and diabetes.  Please see the attached documents for additional preventive care recommendations.

## 2023-12-25 NOTE — Progress Notes (Unsigned)
 Chief Complaint  Patient presents with   Annual Exam   Medicare Wellness     Subjective:   Anita Wagner is a 68 y.o. female who presents for a Medicare Annual Wellness Visit.  Visit info / Clinical Intake: Medicare Wellness Visit Type:: Subsequent Annual Wellness Visit Persons participating in visit and providing information:: patient Medicare Wellness Visit Mode:: In-person (required for WTM) Interpreter Needed?: No Pre-visit prep was completed: yes AWV questionnaire completed by patient prior to visit?: yes Date:: 12/21/23 Living arrangements:: lives with spouse/significant other Patient's Overall Health Status Rating: very good Typical amount of pain: none Does pain affect daily life?: no Are you currently prescribed opioids?: no  Dietary Habits and Nutritional Risks How many meals a day?: 3 Eats fruit and vegetables daily?: yes Most meals are obtained by: preparing own meals In the last 2 weeks, have you had any of the following?: none Diabetic:: no  Functional Status Activities of Daily Living (to include ambulation/medication): Independent Ambulation: Independent Medication Administration: Independent Home Management (perform basic housework or laundry): Independent Manage your own finances?: yes Primary transportation is: driving Concerns about vision?: no *vision screening is required for WTM* Concerns about hearing?: no  Fall Screening Falls in the past year?: 0 Number of falls in past year: 1 Was there an injury with Fall?: 0 Fall Risk Category Calculator: 1 Patient Fall Risk Level: Low Fall Risk  Fall Risk Patient at Risk for Falls Due to: No Fall Risks Fall risk Follow up: Falls evaluation completed  Home and Transportation Safety: All rugs have non-skid backing?: yes All stairs or steps have railings?: yes Grab bars in the bathtub or shower?: yes Have non-skid surface in bathtub or shower?: yes Good home lighting?: yes Regular seat belt  use?: yes Hospital stays in the last year:: no  Cognitive Assessment Difficulty concentrating, remembering, or making decisions? : no Will 6CIT or Mini Cog be Completed: yes What year is it?: 0 points What month is it?: 0 points About what time is it?: 0 points Count backwards from 20 to 1: 0 points Say the months of the year in reverse: 0 points Repeat the address phrase from earlier: 0 points 6 CIT Score: 0 points  Advance Directives (For Healthcare) Does Patient Have a Medical Advance Directive?: Yes Does patient want to make changes to medical advance directive?: No - Patient declined Type of Advance Directive: Living will; Healthcare Power of Attorney Copy of Healthcare Power of Attorney in Chart?: No - copy requested Copy of Living Will in Chart?: No - copy requested  Reviewed/Updated  Reviewed/Updated: Reviewed All (Medical, Surgical, Family, Medications, Allergies, Care Teams, Patient Goals)    Allergies (verified) Amoxicillin-pot clavulanate, Sulfa antibiotics, Erythromycin, and Levofloxacin   Current Medications (verified) Outpatient Encounter Medications as of 12/24/2023  Medication Sig   acetaminophen (TYLENOL) 325 MG tablet Take 650 mg by mouth every 6 (six) hours as needed.   aspirin 81 MG tablet Take 81 mg by mouth daily.   budesonide -formoterol  (SYMBICORT ) 160-4.5 MCG/ACT inhaler Inhale 2 puffs into the lungs 2 (two) times daily.   clonazePAM  (KLONOPIN ) 1 MG tablet TAKE 1/2 TABLET IN THE MORNING AND 1 TABLET AT BEDTIME   Multiple Vitamin (MULTIVITAMIN) capsule Take 1 capsule by mouth daily.   simvastatin  (ZOCOR ) 20 MG tablet Take 1 tablet (20 mg total) by mouth at bedtime.   [DISCONTINUED] albuterol  (VENTOLIN  HFA) 108 (90 Base) MCG/ACT inhaler TAKE 2 PUFFS BY MOUTH EVERY 6 HOURS AS NEEDED FOR WHEEZE OR SHORTNESS OF  BREATH   [DISCONTINUED] magic mouthwash (lidocaine , diphenhydrAMINE, alum & mag hydroxide) suspension 2 teaspoons by mouth -swish do not swallow 4  times a day   [DISCONTINUED] methylPREDNISolone  (MEDROL  DOSEPAK) 4 MG TBPK tablet Take in tapering course as directed 6-5-4-3-2-1   No facility-administered encounter medications on file as of 12/24/2023.    History: Past Medical History:  Diagnosis Date   Anxiety    Diverticulitis    2016   DVT (deep venous thrombosis) (HCC)    calf- 2014   GERD (gastroesophageal reflux disease)    Seizures (HCC)    at age 43- blood sugar issue; none since   Past Surgical History:  Procedure Laterality Date   ABDOMINAL HYSTERECTOMY     tah bso   BREAST BIOPSY     yrs ago   ENDOMETRIAL ABLATION     TONSILLECTOMY     Family History  Problem Relation Age of Onset   Congestive Heart Failure Mother    Arthritis Mother    Varicose Veins Mother    Cancer Father    Alcohol abuse Father    Hearing loss Father    Colon cancer Neg Hx    Esophageal cancer Neg Hx    Rectal cancer Neg Hx    Stomach cancer Neg Hx    Breast cancer Neg Hx    BRCA 1/2 Neg Hx    Social History   Occupational History   Not on file  Tobacco Use   Smoking status: Every Day    Current packs/day: 1.00    Average packs/day: 1 pack/day for 40.0 years (40.0 ttl pk-yrs)    Types: Cigarettes   Smokeless tobacco: Never  Substance and Sexual Activity   Alcohol use: Not Currently    Alcohol/week: 5.0 standard drinks of alcohol    Comment: daily- 1-2 glasses   Drug use: Never   Sexual activity: Not Currently   Tobacco Counseling Ready to quit: Yes Counseling given: Not Answered  SDOH Screenings   Food Insecurity: No Food Insecurity (12/24/2023)  Housing: Low Risk (12/24/2023)  Transportation Needs: No Transportation Needs (12/24/2023)  Utilities: Not At Risk (12/24/2023)  Alcohol Screen: Low Risk (12/25/2023)  Depression (PHQ2-9): Low Risk (12/25/2023)  Financial Resource Strain: Low Risk (12/25/2023)  Physical Activity: Insufficiently Active (12/25/2023)  Social Connections: Socially Isolated (12/24/2023)   Stress: No Stress Concern Present (12/25/2023)  Tobacco Use: High Risk (12/24/2023)  Health Literacy: Adequate Health Literacy (12/24/2023)   See flowsheets for full screening details  Depression Screen PHQ 2 & 9 Depression Scale- Over the past 2 weeks, how often have you been bothered by any of the following problems? Little interest or pleasure in doing things: 0 Feeling down, depressed, or hopeless (PHQ Adolescent also includes...irritable): 0 PHQ-2 Total Score: 0     Goals Addressed               This Visit's Progress     Exercise 3x per week (30 min per time) (pt-stated)               Objective:    Today's Vitals   12/24/23 1402  BP: 120/70  Pulse: 90  SpO2: 97%  Weight: 192 lb (87.1 kg)  Height: 5' 6 (1.676 m)  PainSc: 0-No pain   Body mass index is 30.99 kg/m.  Hearing/Vision screen No results found. Immunizations and Health Maintenance Health Maintenance  Topic Date Due   DTaP/Tdap/Td (3 - Td or Tdap) 12/24/2021   COVID-19 Vaccine (7 - 2025-26 season)  09/10/2023   Colonoscopy  12/23/2024 (Originally 12/15/2019)   Lung Cancer Screening  02/13/2024   Mammogram  02/26/2024   Medicare Annual Wellness (AWV)  12/23/2024   Pneumococcal Vaccine: 50+ Years  Completed   Influenza Vaccine  Completed   Bone Density Scan  Completed   Zoster Vaccines- Shingrix  Completed   Meningococcal B Vaccine  Aged Out   Hepatitis C Screening  Discontinued        Assessment/Plan:  This is a routine wellness examination for Anita Wagner.  Patient Care Team: Perri Ronal PARAS, MD as PCP - General (Internal Medicine) Porter Andrez SAUNDERS, PA-C (Inactive) as Physician Assistant (Dermatology)  I have personally reviewed and noted the following in the patients chart:   Medical and social history Use of alcohol, tobacco or illicit drugs  Current medications and supplements including opioid prescriptions. Functional ability and status Nutritional status Physical  activity Advanced directives List of other physicians Hospitalizations, surgeries, and ER visits in previous 12 months Vitals Screenings to include cognitive, depression, and falls Referrals and appointments  Orders Placed This Encounter  Procedures   Flu vaccine HIGH DOSE PF(Fluzone Trivalent)   POCT URINALYSIS DIP (CLINITEK)   In addition, I have reviewed and discussed with patient certain preventive protocols, quality metrics, and best practice recommendations. A written personalized care plan for preventive services as well as general preventive health recommendations were provided to patient.   Eddrick Dilone, CMA   12/25/2023   Return in about 6 months (around 06/23/2024).  After Visit Summary: (In Person-Printed) AVS printed and given to the patient  Nurse Notes: none

## 2024-02-09 ENCOUNTER — Other Ambulatory Visit: Payer: Self-pay | Admitting: Internal Medicine
